# Patient Record
Sex: Female | Born: 1960 | Race: Black or African American | Hispanic: No | Marital: Single | Smoking: Former smoker
Health system: Southern US, Community
[De-identification: ages and names within clinical notes are randomized; demographics above are authoritative.]

## PROBLEM LIST (undated history)

## (undated) DIAGNOSIS — M797 Fibromyalgia: Secondary | ICD-10-CM

## (undated) DIAGNOSIS — F329 Major depressive disorder, single episode, unspecified: Secondary | ICD-10-CM

## (undated) DIAGNOSIS — J302 Other seasonal allergic rhinitis: Secondary | ICD-10-CM

## (undated) DIAGNOSIS — F32A Depression, unspecified: Secondary | ICD-10-CM

## (undated) DIAGNOSIS — J4489 Other specified chronic obstructive pulmonary disease: Secondary | ICD-10-CM

## (undated) DIAGNOSIS — J301 Allergic rhinitis due to pollen: Secondary | ICD-10-CM

## (undated) DIAGNOSIS — K219 Gastro-esophageal reflux disease without esophagitis: Secondary | ICD-10-CM

## (undated) DIAGNOSIS — M7918 Myalgia, other site: Secondary | ICD-10-CM

## (undated) DIAGNOSIS — J45909 Unspecified asthma, uncomplicated: Secondary | ICD-10-CM

## (undated) DIAGNOSIS — G4734 Idiopathic sleep related nonobstructive alveolar hypoventilation: Secondary | ICD-10-CM

## (undated) DIAGNOSIS — F411 Generalized anxiety disorder: Secondary | ICD-10-CM

## (undated) DIAGNOSIS — G4730 Sleep apnea, unspecified: Secondary | ICD-10-CM

## (undated) DIAGNOSIS — D649 Anemia, unspecified: Secondary | ICD-10-CM

## (undated) DIAGNOSIS — J449 Chronic obstructive pulmonary disease, unspecified: Secondary | ICD-10-CM

## (undated) DIAGNOSIS — M1990 Unspecified osteoarthritis, unspecified site: Secondary | ICD-10-CM

## (undated) DIAGNOSIS — M069 Rheumatoid arthritis, unspecified: Secondary | ICD-10-CM

## (undated) DIAGNOSIS — I10 Essential (primary) hypertension: Secondary | ICD-10-CM

## (undated) DIAGNOSIS — M359 Systemic involvement of connective tissue, unspecified: Secondary | ICD-10-CM

## (undated) DIAGNOSIS — R011 Cardiac murmur, unspecified: Secondary | ICD-10-CM

## (undated) DIAGNOSIS — F419 Anxiety disorder, unspecified: Secondary | ICD-10-CM

## (undated) HISTORY — PX: ANKLE FRACTURE SURGERY: SHX122

## (undated) HISTORY — PX: JOINT REPLACEMENT: SHX530

## (undated) HISTORY — PX: FRACTURE SURGERY: SHX138

## (undated) HISTORY — PX: ABDOMINAL HYSTERECTOMY: SHX81

## (undated) HISTORY — PX: TUBAL LIGATION: SHX77

---

## 2003-11-24 ENCOUNTER — Emergency Department: Payer: Self-pay | Admitting: Emergency Medicine

## 2004-01-16 ENCOUNTER — Inpatient Hospital Stay: Payer: Self-pay | Admitting: Obstetrics and Gynecology

## 2004-01-24 ENCOUNTER — Emergency Department: Payer: Self-pay | Admitting: Unknown Physician Specialty

## 2006-03-03 ENCOUNTER — Emergency Department: Payer: Self-pay | Admitting: Emergency Medicine

## 2006-03-03 ENCOUNTER — Other Ambulatory Visit: Payer: Self-pay

## 2006-03-06 ENCOUNTER — Ambulatory Visit: Payer: Self-pay | Admitting: Family Medicine

## 2010-03-19 ENCOUNTER — Emergency Department: Payer: Self-pay | Admitting: Emergency Medicine

## 2011-01-03 ENCOUNTER — Emergency Department: Payer: Self-pay | Admitting: *Deleted

## 2013-04-30 ENCOUNTER — Observation Stay: Payer: Self-pay | Admitting: Internal Medicine

## 2013-04-30 LAB — CBC WITH DIFFERENTIAL/PLATELET
BASOS ABS: 0.1 10*3/uL (ref 0.0–0.1)
Basophil %: 0.8 %
Eosinophil #: 0.4 10*3/uL (ref 0.0–0.7)
Eosinophil %: 4.3 %
HCT: 40.6 % (ref 35.0–47.0)
HGB: 12.9 g/dL (ref 12.0–16.0)
LYMPHS ABS: 3.3 10*3/uL (ref 1.0–3.6)
Lymphocyte %: 37.7 %
MCH: 29.1 pg (ref 26.0–34.0)
MCHC: 31.8 g/dL — ABNORMAL LOW (ref 32.0–36.0)
MCV: 92 fL (ref 80–100)
Monocyte #: 1.1 x10 3/mm — ABNORMAL HIGH (ref 0.2–0.9)
Monocyte %: 12.6 %
Neutrophil #: 3.9 10*3/uL (ref 1.4–6.5)
Neutrophil %: 44.6 %
Platelet: 406 10*3/uL (ref 150–440)
RBC: 4.44 10*6/uL (ref 3.80–5.20)
RDW: 15.4 % — ABNORMAL HIGH (ref 11.5–14.5)
WBC: 8.8 10*3/uL (ref 3.6–11.0)

## 2013-04-30 LAB — BASIC METABOLIC PANEL
ANION GAP: 4 — AB (ref 7–16)
BUN: 16 mg/dL (ref 7–18)
CO2: 28 mmol/L (ref 21–32)
CREATININE: 0.85 mg/dL (ref 0.60–1.30)
Calcium, Total: 9.2 mg/dL (ref 8.5–10.1)
Chloride: 106 mmol/L (ref 98–107)
EGFR (African American): >60
Glucose: 124 mg/dL — ABNORMAL HIGH (ref 65–99)
OSMOLALITY: 278 (ref 275–301)
POTASSIUM: 4 mmol/L (ref 3.5–5.1)
Sodium: 138 mmol/L (ref 136–145)

## 2013-04-30 LAB — D-DIMER(ARMC): D-Dimer: 1205 ng/ml

## 2013-04-30 LAB — TROPONIN I: Troponin-I: 0.02 ng/mL

## 2013-05-01 DIAGNOSIS — I517 Cardiomegaly: Secondary | ICD-10-CM

## 2013-05-01 LAB — TROPONIN I
Troponin-I: 0.12 ng/mL — ABNORMAL HIGH
Troponin-I: 0.13 ng/mL — ABNORMAL HIGH
Troponin-I: 0.18 ng/mL — ABNORMAL HIGH
Troponin-I: 0.14 ng/mL — ABNORMAL HIGH

## 2014-03-29 ENCOUNTER — Emergency Department: Payer: Self-pay | Admitting: Emergency Medicine

## 2014-04-20 DIAGNOSIS — M8580 Other specified disorders of bone density and structure, unspecified site: Secondary | ICD-10-CM | POA: Diagnosis not present

## 2014-04-20 DIAGNOSIS — M25461 Effusion, right knee: Secondary | ICD-10-CM | POA: Diagnosis not present

## 2014-04-20 DIAGNOSIS — M25562 Pain in left knee: Secondary | ICD-10-CM | POA: Diagnosis not present

## 2014-04-20 DIAGNOSIS — M170 Bilateral primary osteoarthritis of knee: Secondary | ICD-10-CM | POA: Diagnosis not present

## 2014-04-20 DIAGNOSIS — M25462 Effusion, left knee: Secondary | ICD-10-CM | POA: Diagnosis not present

## 2014-04-20 DIAGNOSIS — M25561 Pain in right knee: Secondary | ICD-10-CM | POA: Diagnosis not present

## 2014-04-30 NOTE — H&P (Signed)
PATIENT NAME:  Michelle Dalton, MEAS MR#:  [id number] DATE OF BIRTH:  1960-01-13  DATE OF ADMISSION:  04/30/2013  REFERRING PHYSICIAN: Dr. Jacqualine Code.  PRIMARY CARE PHYSICIAN: At [facility].   CHIEF COMPLAINT: Passing out.  HISTORY OF PRESENT ILLNESS: A 54 year old with a history of asthma, which is persistent, but well-controlled presenting after a syncopal episode and she has had shortness of breath for a 2-3-day duration with associated nonproductive cough. No fevers, chills, chest pain, palpitations. She states that her onsetting factor was the pollen and  she has had an episode of coughing which she describes as "bad" but was nonproductive, associated with shortness of breath. She went to get her nebulizer; however, after getting her nebulizer treatment she was still coughing. She passed out. She unfortunately does not recall any further parts of the events. No loss of bowel or bladder function. Per her husband, who witnessed the event, she simply slumped over. However, he thought that she turned blue. No loss of pulse. Spontaneous returned to baseline within a "few minutes." She is currently asymptomatic.   REVIEW OF SYSTEMS:  CONSTITUTIONAL: Denies fever, fatigue, weakness. EYES: Denies blurry, double vision, or eye pain.  HEENT: Denies tinnitus, ear pain, hearing loss.  RESPIRATORY: Positive for cough and shortness of breath as described above.  CARDIOVASCULAR: Denies chest pain, palpitations, edema.  GASTROINTESTINAL: Denies nausea, vomiting, diarrhea, abdominal pain.  GENITOURINARY: Denies dysuria or hematuria.  ENDOCRINE: Denies nocturia or thyroid problems.  HEMATOLOGIC AND LYMPHATIC: Denies easy bruising, bleeding. SKIN: Denies rash or lesions. MUSCULOSKELETAL: Denies pain in neck, back, shoulders, knees or hips.  NEUROLOGIC: Denies paralysis or paresthesias.  PSYCHIATRIC: Denies anxiety or depressive symptoms.   Otherwise, full review of systems performed is negative. Review of systems.    PAST MEDICAL HISTORY: Hypertension and asthma, which is persistent, but well-controlled. On a regular basis, she does not need her rescue inhaler. She has never required intubation.   SOCIAL HISTORY: Denies any tobacco or drug usage. Positive for occasional alcohol.   FAMILY HISTORY: Positive for coronary artery to coronary artery disease.   ALLERGIES: IBUPROFEN.   HOME MEDICATIONS: Include, all of unknown dosages, but Advair, albuterol, hydrochlorothiazide, and Norvasc and Singulair.   PHYSICAL EXAMINATION:  VITAL SIGNS: Temperature 98, heart rate 82, respirations 22, blood pressure 131/82, saturating 96% on room air. Weight 98 kg, BMI 33.9.  GENERAL: Well-nourished, well-developed, African American female, currently in no acute distress.  HEAD: Normocephalic, atraumatic.  EYES: Pupils equal, round, and reactive light. Extraocular muscles intact. No scleral icterus.  MOUTH: Moist mucosal membrane. Dentition intact. No abscess noted EARS, NOSE, AND THROAT: Clear without exudates. No external lesions.  NECK: Supple. No thyromegaly. No nodules. No JVD.  PULMONARY: Clear to auscultation bilaterally without wheezes, rales, or rhonchi. No use of accessory muscles. Good respiratory effort.  CHEST:  Nontender to palpation.  CARDIOVASCULAR: S1, S2, regular rate and rhythm. No murmurs, rubs, or gallops. No edema. Pedal pulses 2+ bilaterally.  GASTROINTESTINAL: Soft, nontender, nondistended. No masses. Positive bowel sounds. No hepatosplenomegaly.  MUSCULOSKELETAL: No swelling, clubbing, or edema. Range of motion is full in all extremities. NEUROLOGIC: Cranial nerves II through XII intact. No gross neurologic deficits. Sensation intact. Reflexes intact.  SKIN: No ulcerations, lesions, no rashes, or cyanosis. Skin warm and dry. Turgor intact. PSYCHIATRIC: Mood and affect within normal limits. The patient awake, alert, and oriented x 3. Insight and judgment intact.   LABORATORY DATA:  EKG: Normal  sinus rhythm with evidence of LVH. The remainder of laboratory data:  Chest x-ray performed revealing mild vascular congestion with cardiomegaly.  Remainder of laboratory data: Sodium 138, potassium 4, chloride 106, bicarbonate 28, BUN 16, creatinine 0.85, glucose 124, troponin less than 0.02. WBC 8.8, hemoglobin 12.9, platelets 406.   ASSESSMENT AND PLAN: A 54 year old [place] American female with a history of asthma presenting after a syncopal episode.  1. Syncope, we will admit to telemetry under an observational status. Trend cardiac enzymes x 3. Question whether this was a posttussive vagal response. Her Wells Score is zero. However, we will check D-dimer given onset of shortness of breath followed by syncope. If positive, we will get CT angiogram to rule out pulmonary embolism, already discussed with the patient.  2. Asthma exacerbation. We will continue her home medications, add p.r.n. nebulizer treatments as required, supplemental O2 to keep oxygen saturation greater than 92% and Solu-Medrol 60 mg daily.  3. Hypertension. Continue with Norvasc.  4. Venous thromboembolism prophylaxis with heparin subcutaneous.   CODE STATUS: The patient is full code.   TIME SPENT: 45 minutes.    ____________________________ Aaron Mose. Issabela Lesko, MD dkh:lt D: 04/30/2013 22:27:22 ET T: 04/30/2013 22:47:11 ET JOB#: [id number]  cc: Aaron Mose. Fredericka Bottcher, MD, <Dictator> Louise Victory Woodfin Ganja MD ELECTRONICALLY SIGNED 05/01/2013 1:37

## 2014-04-30 NOTE — Discharge Summary (Signed)
PATIENT NAME:  Michelle Dalton, HARTL MR#:  [id number] DATE OF BIRTH:  08/01/1960  DATE OF ADMISSION:  04/30/2013 DATE OF DISCHARGE:  05/01/2013  ADMISSION DIAGNOSES:  1. Syncope.  2. Asthma exacerbation.  DISCHARGE DIAGNOSES:  1. Syncope, likely secondary vasovagal reaction.  2. Asthma.  3. Hypertension. 4. Elevated troponin.   CONSULTATIONS: None.   IMAGING: The patient underwent a 2D echocardiogram which essentially showed normal ejection fraction. She has mild concentric LVH.   Troponins at discharge: 0.14. Troponin max 0.18.   HOSPITAL COURSE: A verapamil 54 year old female with a history of asthma, who presented after a syncopal event. For further details, please refer to the H and P.  1. Syncope. I suspect this is likely related to vasovagal reaction as she was coughing and had an acute exacerbation of her asthma and then apparently turned blue and according to the husband, he had to perform some CPR. She had no evidence on telemetry. Her troponins were slightly elevated but I think her troponins were elevated to demand ischemia from her hypoxia from her asthma exacerbation. She is doing well. Her echocardiogram shows no acute etiology for syncope.  2. Elevated troponins likely demand ischemia from her cardiopulmonary resuscitation/asthma. The patient denied any chest pain. No history of coronary artery disease. Her echocardiogram as stated above. Troponins are trending down.  3. CT of the chest is negative for pulmonary embolism. 4. Asthma exacerbation is improved. The patient will be discharged with prednisone taper.  5. Hypertension, on Norvasc.  DISCHARGE MEDICATIONS:  1. Prednisone taper starting at 50 mg, taper by 10 mg every 2 days.  2. Norvasc.  3. Hydrochlorothiazide. 4. Singulair. 5. Albuterol ipratropium nebulizer.   DISCHARGE DIET: Low sodium.   DISCHARGE ACTIVITY: As tolerated.   DISCHARGE FOLLOWUP: The patient will need a followup with her primary care physician in  2 weeks.   TIME SPENT: Approximately 35 minutes.    ____________________________ Donell Beers. Benjie Karvonen, MD spm:lt D: 05/01/2013 12:46:45 ET T: 05/02/2013 01:42:48 ET JOB#: [id number]  cc: Luqman Perrelli P. Benjie Karvonen, MD, <Dictator> Donell Beers Jayla Mackie MD ELECTRONICALLY SIGNED 05/02/2013 13:31

## 2014-05-02 DIAGNOSIS — J4520 Mild intermittent asthma, uncomplicated: Secondary | ICD-10-CM | POA: Diagnosis not present

## 2014-05-02 DIAGNOSIS — I10 Essential (primary) hypertension: Secondary | ICD-10-CM | POA: Diagnosis not present

## 2014-05-02 DIAGNOSIS — J302 Other seasonal allergic rhinitis: Secondary | ICD-10-CM | POA: Diagnosis not present

## 2014-05-02 DIAGNOSIS — M170 Bilateral primary osteoarthritis of knee: Secondary | ICD-10-CM | POA: Diagnosis not present

## 2014-05-26 DIAGNOSIS — M7061 Trochanteric bursitis, right hip: Secondary | ICD-10-CM | POA: Diagnosis not present

## 2014-05-26 DIAGNOSIS — I10 Essential (primary) hypertension: Secondary | ICD-10-CM | POA: Diagnosis not present

## 2014-05-26 DIAGNOSIS — M170 Bilateral primary osteoarthritis of knee: Secondary | ICD-10-CM | POA: Diagnosis not present

## 2014-05-26 DIAGNOSIS — G894 Chronic pain syndrome: Secondary | ICD-10-CM | POA: Diagnosis not present

## 2014-06-02 ENCOUNTER — Encounter: Payer: Self-pay | Admitting: Medical Oncology

## 2014-06-02 ENCOUNTER — Emergency Department
Admission: EM | Admit: 2014-06-02 | Discharge: 2014-06-02 | Disposition: A | Discharge status: Home / Self Care | Payer: Commercial Managed Care - HMO | Attending: Emergency Medicine | Admitting: Emergency Medicine

## 2014-06-02 DIAGNOSIS — I10 Essential (primary) hypertension: Secondary | ICD-10-CM | POA: Diagnosis not present

## 2014-06-02 DIAGNOSIS — R51 Headache: Secondary | ICD-10-CM | POA: Diagnosis present

## 2014-06-02 DIAGNOSIS — J0100 Acute maxillary sinusitis, unspecified: Secondary | ICD-10-CM | POA: Insufficient documentation

## 2014-06-02 HISTORY — DX: Other seasonal allergic rhinitis: J30.2

## 2014-06-02 HISTORY — DX: Essential (primary) hypertension: I10

## 2014-06-02 HISTORY — DX: Unspecified osteoarthritis, unspecified site: M19.90

## 2014-06-02 HISTORY — DX: Unspecified asthma, uncomplicated: J45.909

## 2014-06-02 MED ORDER — PREDNISONE 10 MG PO TABS: 50.0000 mg | ORAL_TABLET | Freq: Every day | ORAL | Status: DC

## 2014-06-02 MED ORDER — SULFAMETHOXAZOLE-TRIMETHOPRIM 800-160 MG PO TABS: 1.0000 | ORAL_TABLET | Freq: Two times a day (BID) | ORAL | Status: DC

## 2014-06-02 NOTE — ED Provider Notes (Signed)
[facility] Emergency Department Provider Note  ____________________________________________  Time seen: Approximately 1:46 PM  I have reviewed the triage vital signs and the nursing notes.   HISTORY  Chief Complaint Facial Pain    HPI Michelle Dalton is a 54 y.o. female who presents with a 3 or four-day history of right facial pain and pressure and generalized headache. She states that she has felt her allergies were flaring up and she tried using her Nasonex without any relief. She states the pain under the right eye and the side of her nose is getting worse.   Past Medical History  Diagnosis Date  . Hypertension   . Asthma   . Seasonal allergies   . Arthritis     There are no active problems to display for this patient.   History reviewed. No pertinent past surgical history.  Current Outpatient Rx  Name  Route  Sig  Dispense  Refill  . predniSONE (DELTASONE) 10 MG tablet   Oral   Take 5 tablets (50 mg total) by mouth daily.   25 tablet   0   . sulfamethoxazole-trimethoprim (BACTRIM DS,SEPTRA DS) 800-160 MG per tablet   Oral   Take 1 tablet by mouth 2 (two) times daily.   20 tablet   0     Allergies Lisinopril  No family history on file.  Social History History  Substance Use Topics  . Smoking status: Never Smoker   . Smokeless tobacco: Not on file  . Alcohol Use: No    Review of Systems Constitutional: No fever/chills Eyes: No visual changes. ENT: No sore throat. Cardiovascular: Denies chest pain. Respiratory: Denies shortness of breath. Gastrointestinal: No abdominal pain.  No nausea, no vomiting.  No diarrhea.  No constipation. Genitourinary: Negative for dysuria. Musculoskeletal: Negative for back pain. Skin: Negative for rash. Neurological: Positive for headaches, negative for focal weakness or numbness.  10-point ROS otherwise negative.  ____________________________________________   PHYSICAL EXAM:  VITAL  SIGNS: ED Triage Vitals  Enc Vitals Group     BP 06/02/14 1158 164/77 mmHg     Pulse Rate 06/02/14 1158 92     Resp 06/02/14 1158 18     Temp 06/02/14 1158 97.9 F (36.6 C)     Temp Source 06/02/14 1158 Oral     SpO2 06/02/14 1158 100 %     Weight 06/02/14 1158 214 lb (97.07 kg)     Height 06/02/14 1158 5\' 7"  (1.702 m)     Head Cir --      Peak Flow --      Pain Score 06/02/14 1158 10     Pain Loc --      Pain Edu? --      Excl. in [place]? --     Constitutional: Alert and oriented. Well appearing and in no acute distress. Eyes: Conjunctivae are normal. PERRL. EOMI. Head: Atraumatic. Nose: No congestion/rhinnorhea. Mouth/Throat: Mucous membranes are moist.  Oropharynx non-erythematous. Tenderness over maxillary and ethmoid sinus on the right. Neck: No stridor.   Hematological/Lymphatic/Immunilogical: No cervical lymphadenopathy. Cardiovascular: Normal rate, regular rhythm. Grossly normal heart sounds.  Good peripheral circulation. Respiratory: Normal respiratory effort.  No retractions. Lungs CTAB. Gastrointestinal: Soft and nontender. No distention.  Musculoskeletal: No lower extremity tenderness nor edema.  No joint effusions. Neurologic:  Normal speech and language. No gross focal neurologic deficits are appreciated. Speech is normal. No gait instability. Skin:  Skin is warm, dry and intact. No rash noted. Psychiatric: Mood and affect are  normal. Speech and behavior are normal.  ____________________________________________   LABS (all labs ordered are listed, but only abnormal results are displayed)  Labs Reviewed - No data to display ____________________________________________  EKG   ____________________________________________  RADIOLOGY   ____________________________________________   PROCEDURES  Procedure(s) performed: None  Critical Care performed: No  ____________________________________________   INITIAL IMPRESSION / ASSESSMENT AND PLAN / ED  COURSE  Pertinent labs & imaging results that were available during my care of the patient were reviewed by me and considered in my medical decision making (see chart for details).  Patient was advised to follow up with her primary care provider for symptoms that are not improving after 2 days. She was advised to return to the emergency department for symptoms that change or worsen if she is unable schedule an appointment. ____________________________________________   FINAL CLINICAL IMPRESSION(S) / ED DIAGNOSES  Final diagnoses:  Acute maxillary sinusitis, recurrence not specified      Victorino Dike, FNP 06/02/14 1350  Patient was seen by the mid-level I was in the ER and available for consult during this time  Nena Polio, MD 06/02/14 1555

## 2014-06-02 NOTE — ED Notes (Signed)
Pt ambulatory to triage with reports of rt sided facial pressure for a few days. Also reports nasal congestion.

## 2014-06-20 DIAGNOSIS — I10 Essential (primary) hypertension: Secondary | ICD-10-CM | POA: Diagnosis not present

## 2014-06-20 DIAGNOSIS — Z01810 Encounter for preprocedural cardiovascular examination: Secondary | ICD-10-CM | POA: Diagnosis not present

## 2014-07-18 DIAGNOSIS — M1990 Unspecified osteoarthritis, unspecified site: Secondary | ICD-10-CM | POA: Diagnosis not present

## 2014-07-18 DIAGNOSIS — M21062 Valgus deformity, not elsewhere classified, left knee: Secondary | ICD-10-CM | POA: Diagnosis not present

## 2014-07-18 DIAGNOSIS — Z01818 Encounter for other preprocedural examination: Secondary | ICD-10-CM | POA: Diagnosis not present

## 2014-07-18 DIAGNOSIS — Z01812 Encounter for preprocedural laboratory examination: Secondary | ICD-10-CM | POA: Diagnosis not present

## 2014-07-26 DIAGNOSIS — Z7982 Long term (current) use of aspirin: Secondary | ICD-10-CM | POA: Diagnosis not present

## 2014-07-26 DIAGNOSIS — I10 Essential (primary) hypertension: Secondary | ICD-10-CM | POA: Diagnosis not present

## 2014-07-26 DIAGNOSIS — M1712 Unilateral primary osteoarthritis, left knee: Secondary | ICD-10-CM | POA: Diagnosis not present

## 2014-07-26 DIAGNOSIS — F419 Anxiety disorder, unspecified: Secondary | ICD-10-CM | POA: Diagnosis not present

## 2014-07-26 DIAGNOSIS — T797XXA Traumatic subcutaneous emphysema, initial encounter: Secondary | ICD-10-CM | POA: Diagnosis not present

## 2014-07-26 DIAGNOSIS — M1612 Unilateral primary osteoarthritis, left hip: Secondary | ICD-10-CM | POA: Diagnosis not present

## 2014-07-26 DIAGNOSIS — M21062 Valgus deformity, not elsewhere classified, left knee: Secondary | ICD-10-CM | POA: Diagnosis not present

## 2014-07-26 DIAGNOSIS — Z96652 Presence of left artificial knee joint: Secondary | ICD-10-CM | POA: Diagnosis not present

## 2014-07-26 DIAGNOSIS — Z9889 Other specified postprocedural states: Secondary | ICD-10-CM | POA: Diagnosis not present

## 2014-07-26 DIAGNOSIS — J4520 Mild intermittent asthma, uncomplicated: Secondary | ICD-10-CM | POA: Diagnosis not present

## 2014-07-26 DIAGNOSIS — F329 Major depressive disorder, single episode, unspecified: Secondary | ICD-10-CM | POA: Diagnosis not present

## 2014-07-26 DIAGNOSIS — Z8249 Family history of ischemic heart disease and other diseases of the circulatory system: Secondary | ICD-10-CM | POA: Diagnosis not present

## 2014-07-29 DIAGNOSIS — M1990 Unspecified osteoarthritis, unspecified site: Secondary | ICD-10-CM | POA: Diagnosis not present

## 2014-07-29 DIAGNOSIS — J45909 Unspecified asthma, uncomplicated: Secondary | ICD-10-CM | POA: Diagnosis not present

## 2014-07-29 DIAGNOSIS — F418 Other specified anxiety disorders: Secondary | ICD-10-CM | POA: Diagnosis not present

## 2014-07-29 DIAGNOSIS — I10 Essential (primary) hypertension: Secondary | ICD-10-CM | POA: Diagnosis not present

## 2014-07-29 DIAGNOSIS — Z7982 Long term (current) use of aspirin: Secondary | ICD-10-CM | POA: Diagnosis not present

## 2014-07-29 DIAGNOSIS — Z96652 Presence of left artificial knee joint: Secondary | ICD-10-CM | POA: Diagnosis not present

## 2014-07-29 DIAGNOSIS — Z4801 Encounter for change or removal of surgical wound dressing: Secondary | ICD-10-CM | POA: Diagnosis not present

## 2014-07-29 DIAGNOSIS — Z471 Aftercare following joint replacement surgery: Secondary | ICD-10-CM | POA: Diagnosis not present

## 2014-08-01 DIAGNOSIS — Z471 Aftercare following joint replacement surgery: Secondary | ICD-10-CM | POA: Diagnosis not present

## 2014-08-01 DIAGNOSIS — J45909 Unspecified asthma, uncomplicated: Secondary | ICD-10-CM | POA: Diagnosis not present

## 2014-08-01 DIAGNOSIS — Z96652 Presence of left artificial knee joint: Secondary | ICD-10-CM | POA: Diagnosis not present

## 2014-08-01 DIAGNOSIS — I10 Essential (primary) hypertension: Secondary | ICD-10-CM | POA: Diagnosis not present

## 2014-08-01 DIAGNOSIS — Z4801 Encounter for change or removal of surgical wound dressing: Secondary | ICD-10-CM | POA: Diagnosis not present

## 2014-08-01 DIAGNOSIS — F418 Other specified anxiety disorders: Secondary | ICD-10-CM | POA: Diagnosis not present

## 2014-08-01 DIAGNOSIS — M1990 Unspecified osteoarthritis, unspecified site: Secondary | ICD-10-CM | POA: Diagnosis not present

## 2014-08-01 DIAGNOSIS — Z7982 Long term (current) use of aspirin: Secondary | ICD-10-CM | POA: Diagnosis not present

## 2014-08-03 DIAGNOSIS — Z96652 Presence of left artificial knee joint: Secondary | ICD-10-CM | POA: Diagnosis not present

## 2014-08-03 DIAGNOSIS — J45909 Unspecified asthma, uncomplicated: Secondary | ICD-10-CM | POA: Diagnosis not present

## 2014-08-03 DIAGNOSIS — Z7982 Long term (current) use of aspirin: Secondary | ICD-10-CM | POA: Diagnosis not present

## 2014-08-03 DIAGNOSIS — I10 Essential (primary) hypertension: Secondary | ICD-10-CM | POA: Diagnosis not present

## 2014-08-03 DIAGNOSIS — Z4801 Encounter for change or removal of surgical wound dressing: Secondary | ICD-10-CM | POA: Diagnosis not present

## 2014-08-03 DIAGNOSIS — Z471 Aftercare following joint replacement surgery: Secondary | ICD-10-CM | POA: Diagnosis not present

## 2014-08-03 DIAGNOSIS — M1990 Unspecified osteoarthritis, unspecified site: Secondary | ICD-10-CM | POA: Diagnosis not present

## 2014-08-03 DIAGNOSIS — F418 Other specified anxiety disorders: Secondary | ICD-10-CM | POA: Diagnosis not present

## 2014-08-05 DIAGNOSIS — Z4801 Encounter for change or removal of surgical wound dressing: Secondary | ICD-10-CM | POA: Diagnosis not present

## 2014-08-05 DIAGNOSIS — J45909 Unspecified asthma, uncomplicated: Secondary | ICD-10-CM | POA: Diagnosis not present

## 2014-08-05 DIAGNOSIS — M1990 Unspecified osteoarthritis, unspecified site: Secondary | ICD-10-CM | POA: Diagnosis not present

## 2014-08-05 DIAGNOSIS — F418 Other specified anxiety disorders: Secondary | ICD-10-CM | POA: Diagnosis not present

## 2014-08-05 DIAGNOSIS — Z471 Aftercare following joint replacement surgery: Secondary | ICD-10-CM | POA: Diagnosis not present

## 2014-08-05 DIAGNOSIS — Z7982 Long term (current) use of aspirin: Secondary | ICD-10-CM | POA: Diagnosis not present

## 2014-08-05 DIAGNOSIS — Z96652 Presence of left artificial knee joint: Secondary | ICD-10-CM | POA: Diagnosis not present

## 2014-08-05 DIAGNOSIS — I10 Essential (primary) hypertension: Secondary | ICD-10-CM | POA: Diagnosis not present

## 2014-08-08 DIAGNOSIS — Z96652 Presence of left artificial knee joint: Secondary | ICD-10-CM | POA: Diagnosis not present

## 2014-08-08 DIAGNOSIS — F418 Other specified anxiety disorders: Secondary | ICD-10-CM | POA: Diagnosis not present

## 2014-08-08 DIAGNOSIS — J45909 Unspecified asthma, uncomplicated: Secondary | ICD-10-CM | POA: Diagnosis not present

## 2014-08-08 DIAGNOSIS — Z471 Aftercare following joint replacement surgery: Secondary | ICD-10-CM | POA: Diagnosis not present

## 2014-08-08 DIAGNOSIS — Z7982 Long term (current) use of aspirin: Secondary | ICD-10-CM | POA: Diagnosis not present

## 2014-08-08 DIAGNOSIS — M1990 Unspecified osteoarthritis, unspecified site: Secondary | ICD-10-CM | POA: Diagnosis not present

## 2014-08-08 DIAGNOSIS — I10 Essential (primary) hypertension: Secondary | ICD-10-CM | POA: Diagnosis not present

## 2014-08-08 DIAGNOSIS — Z4801 Encounter for change or removal of surgical wound dressing: Secondary | ICD-10-CM | POA: Diagnosis not present

## 2014-08-12 DIAGNOSIS — Z96652 Presence of left artificial knee joint: Secondary | ICD-10-CM | POA: Diagnosis not present

## 2014-08-12 DIAGNOSIS — I10 Essential (primary) hypertension: Secondary | ICD-10-CM | POA: Diagnosis not present

## 2014-08-12 DIAGNOSIS — Z7982 Long term (current) use of aspirin: Secondary | ICD-10-CM | POA: Diagnosis not present

## 2014-08-12 DIAGNOSIS — J45909 Unspecified asthma, uncomplicated: Secondary | ICD-10-CM | POA: Diagnosis not present

## 2014-08-12 DIAGNOSIS — F418 Other specified anxiety disorders: Secondary | ICD-10-CM | POA: Diagnosis not present

## 2014-08-12 DIAGNOSIS — M1990 Unspecified osteoarthritis, unspecified site: Secondary | ICD-10-CM | POA: Diagnosis not present

## 2014-08-12 DIAGNOSIS — Z471 Aftercare following joint replacement surgery: Secondary | ICD-10-CM | POA: Diagnosis not present

## 2014-08-12 DIAGNOSIS — Z4801 Encounter for change or removal of surgical wound dressing: Secondary | ICD-10-CM | POA: Diagnosis not present

## 2014-08-16 DIAGNOSIS — M25562 Pain in left knee: Secondary | ICD-10-CM | POA: Diagnosis not present

## 2014-08-16 DIAGNOSIS — Z96652 Presence of left artificial knee joint: Secondary | ICD-10-CM | POA: Diagnosis not present

## 2014-08-19 DIAGNOSIS — Z96652 Presence of left artificial knee joint: Secondary | ICD-10-CM | POA: Diagnosis not present

## 2014-08-19 DIAGNOSIS — M25562 Pain in left knee: Secondary | ICD-10-CM | POA: Diagnosis not present

## 2014-08-31 DIAGNOSIS — J0110 Acute frontal sinusitis, unspecified: Secondary | ICD-10-CM | POA: Diagnosis not present

## 2014-08-31 DIAGNOSIS — J4520 Mild intermittent asthma, uncomplicated: Secondary | ICD-10-CM | POA: Diagnosis not present

## 2014-08-31 DIAGNOSIS — J4521 Mild intermittent asthma with (acute) exacerbation: Secondary | ICD-10-CM | POA: Diagnosis not present

## 2014-08-31 DIAGNOSIS — Z1389 Encounter for screening for other disorder: Secondary | ICD-10-CM | POA: Diagnosis not present

## 2014-08-31 DIAGNOSIS — Z23 Encounter for immunization: Secondary | ICD-10-CM | POA: Diagnosis not present

## 2014-08-31 DIAGNOSIS — J302 Other seasonal allergic rhinitis: Secondary | ICD-10-CM | POA: Diagnosis not present

## 2014-08-31 DIAGNOSIS — F329 Major depressive disorder, single episode, unspecified: Secondary | ICD-10-CM | POA: Diagnosis not present

## 2014-08-31 DIAGNOSIS — I10 Essential (primary) hypertension: Secondary | ICD-10-CM | POA: Diagnosis not present

## 2014-11-17 DIAGNOSIS — I10 Essential (primary) hypertension: Secondary | ICD-10-CM | POA: Diagnosis not present

## 2014-11-17 DIAGNOSIS — Z23 Encounter for immunization: Secondary | ICD-10-CM | POA: Diagnosis not present

## 2014-11-17 DIAGNOSIS — M1611 Unilateral primary osteoarthritis, right hip: Secondary | ICD-10-CM | POA: Diagnosis not present

## 2014-11-17 DIAGNOSIS — Z8739 Personal history of other diseases of the musculoskeletal system and connective tissue: Secondary | ICD-10-CM | POA: Diagnosis not present

## 2014-11-17 DIAGNOSIS — J4520 Mild intermittent asthma, uncomplicated: Secondary | ICD-10-CM | POA: Diagnosis not present

## 2014-11-28 DIAGNOSIS — M1611 Unilateral primary osteoarthritis, right hip: Secondary | ICD-10-CM | POA: Diagnosis not present

## 2014-12-21 DIAGNOSIS — M1611 Unilateral primary osteoarthritis, right hip: Secondary | ICD-10-CM | POA: Diagnosis not present

## 2015-02-06 DIAGNOSIS — M1711 Unilateral primary osteoarthritis, right knee: Secondary | ICD-10-CM | POA: Diagnosis not present

## 2015-02-06 DIAGNOSIS — M1611 Unilateral primary osteoarthritis, right hip: Secondary | ICD-10-CM | POA: Diagnosis not present

## 2015-02-21 DIAGNOSIS — M2550 Pain in unspecified joint: Secondary | ICD-10-CM | POA: Diagnosis not present

## 2015-02-21 DIAGNOSIS — M79642 Pain in left hand: Secondary | ICD-10-CM | POA: Diagnosis not present

## 2015-02-21 DIAGNOSIS — M79641 Pain in right hand: Secondary | ICD-10-CM | POA: Diagnosis not present

## 2015-02-22 ENCOUNTER — Encounter
Admission: RE | Admit: 2015-02-22 | Discharge: 2015-02-22 | Disposition: A | Discharge status: Home / Self Care | Payer: Commercial Managed Care - HMO | Source: Ambulatory Visit | Attending: Surgery | Admitting: Surgery

## 2015-02-22 ENCOUNTER — Ambulatory Visit
Admission: RE | Admit: 2015-02-22 | Discharge: 2015-02-22 | Disposition: A | Discharge status: Home / Self Care | Payer: Commercial Managed Care - HMO | Source: Ambulatory Visit | Attending: Surgery | Admitting: Surgery

## 2015-02-22 DIAGNOSIS — I10 Essential (primary) hypertension: Secondary | ICD-10-CM | POA: Insufficient documentation

## 2015-02-22 DIAGNOSIS — Z01812 Encounter for preprocedural laboratory examination: Secondary | ICD-10-CM | POA: Insufficient documentation

## 2015-02-22 DIAGNOSIS — Z01818 Encounter for other preprocedural examination: Secondary | ICD-10-CM | POA: Diagnosis not present

## 2015-02-22 DIAGNOSIS — J45909 Unspecified asthma, uncomplicated: Secondary | ICD-10-CM | POA: Insufficient documentation

## 2015-02-22 DIAGNOSIS — Z01811 Encounter for preprocedural respiratory examination: Secondary | ICD-10-CM | POA: Diagnosis not present

## 2015-02-22 LAB — CBC
HEMATOCRIT: 38 % (ref 35.0–47.0)
HEMOGLOBIN: 12.5 g/dL (ref 12.0–16.0)
MCH: 28.7 pg (ref 26.0–34.0)
MCHC: 32.8 g/dL (ref 32.0–36.0)
MCV: 87.6 fL (ref 80.0–100.0)
Platelets: 393 10*3/uL (ref 150–440)
RBC: 4.33 MIL/uL (ref 3.80–5.20)
RDW: 14.9 % — AB (ref 11.5–14.5)
WBC: 10.2 10*3/uL (ref 3.6–11.0)

## 2015-02-22 LAB — BASIC METABOLIC PANEL
Anion gap: 12 (ref 5–15)
BUN: 19 mg/dL (ref 6–20)
CHLORIDE: 100 mmol/L — AB (ref 101–111)
CO2: 26 mmol/L (ref 22–32)
CREATININE: 0.81 mg/dL (ref 0.44–1.00)
Calcium: 9.5 mg/dL (ref 8.9–10.3)
GFR calc Af Amer: >60 mL/min (ref >60)
GLUCOSE: 89 mg/dL (ref 65–99)
Potassium: 3.1 mmol/L — ABNORMAL LOW (ref 3.5–5.1)
Sodium: 138 mmol/L (ref 135–145)

## 2015-02-22 LAB — PROTIME-INR
INR: 0.97
PROTHROMBIN TIME: 13.1 s (ref 11.4–15.0)

## 2015-02-22 LAB — TYPE AND SCREEN
ABO/RH(D): B POS
ANTIBODY SCREEN: NEGATIVE

## 2015-02-22 LAB — ABO/RH: ABO/RH(D): B POS

## 2015-02-22 LAB — SURGICAL PCR SCREEN
MRSA, PCR: POSITIVE — AB
STAPHYLOCOCCUS AUREUS: POSITIVE — AB

## 2015-02-22 NOTE — Pre-Procedure Instructions (Signed)
   Component Name Value Range  EKG Ventricular Rate 86 BPM   EKG Atrial Rate 86 BPM   EKG P-R Interval 156 ms  EKG QRS Duration 100 ms  EKG Q-T Interval 376 ms  EKG QTC Calculation 449 ms  EKG Calculated P Axis 60 degrees   EKG Calculated R Axis 65 degrees   EKG Calculated T Axis 48 degrees    Result Narrative  NORMAL SINUS RHYTHM POSSIBLE LEFT ATRIAL ENLARGEMENT  LEFT VENTRICULAR HYPERTROPHY WHEN COMPARED WITH ECG OF 26-Jun-1990 13:34, NO SIGNIFICANT CHANGE WAS FOUND Confirmed by Beatriz Stallion D [id number]) on 07/19/2014 12:04:22 AM

## 2015-02-22 NOTE — Patient Instructions (Signed)
  Your procedure is scheduled on: Tuesday 02/28/15 Report to Day Surgery. 2ND FLOOR MEDICAL MALL ENTRANCE To find out your arrival time please call [phone] between 1PM - 3PM on Monday 02/27/15.  Remember: Instructions that are not followed completely may result in serious medical risk, up to and including death, or upon the discretion of your surgeon and anesthesiologist your surgery may need to be rescheduled.    __X__ 1. Do not eat food or drink liquids after midnight. No gum chewing or hard candies.     __X__ 2. No Alcohol for 24 hours before or after surgery.   ____ 3. Bring all medications with you on the day of surgery if instructed.    __X__ 4. Notify your doctor if there is any change in your medical condition     (cold, fever, infections).     Do not wear jewelry, make-up, hairpins, clips or nail polish.  Do not wear lotions, powders, or perfumes. .  Do not shave 48 hours prior to surgery. Men may shave face and neck.  Do not bring valuables to the hospital.    [facility] is not responsible for any belongings or valuables.               Contacts, dentures or bridgework may not be worn into surgery.  Leave your suitcase in the car. After surgery it may be brought to your room.  For patients admitted to the hospital, discharge time is determined by your                treatment team.   Patients discharged the day of surgery will not be allowed to drive home.   Please read over the following fact sheets that you were given:   MRSA Information and Surgical Site Infection Prevention   __X__ Take these medicines the morning of surgery with A SIP OF WATER:    1. AMLODIPINE  2. HYDRALAZINE  3. LABETOLOL  4. SINGULAIR  5.  6.  ____ Fleet Enema (as directed)   __X__ Use CHG Soap as directed  __X__ Use inhalers on the day of surgery  ____ Stop metformin 2 days prior to surgery    ____ Take 1/2 of usual insulin dose the night before surgery and none on the morning of  surgery.   ____ Stop Coumadin/Plavix/aspirin on   ____ Stop Anti-inflammatories on    ____ Stop supplements until after surgery.    ____ Bring C-Pap to the hospital.

## 2015-02-23 NOTE — Pre-Procedure Instructions (Signed)
EKG DISCUSSED WITH DR Janeann Forehand AND OK TO PROCEED

## 2015-02-23 NOTE — Pre-Procedure Instructions (Signed)
Faxed labs positive MRSA, and low Potassium 3.1 to Dr Nicholaus Bloom office.

## 2015-02-28 ENCOUNTER — Inpatient Hospital Stay: Payer: Commercial Managed Care - HMO | Admitting: Anesthesiology

## 2015-02-28 ENCOUNTER — Inpatient Hospital Stay: Payer: Commercial Managed Care - HMO

## 2015-02-28 ENCOUNTER — Inpatient Hospital Stay
Admission: RE | Admit: 2015-02-28 | Discharge: 2015-03-02 | DRG: 470 | Disposition: A | Discharge status: Skilled Nursing Facility | Payer: Commercial Managed Care - HMO | Source: Ambulatory Visit | Attending: Surgery | Admitting: Surgery

## 2015-02-28 ENCOUNTER — Encounter: Payer: Self-pay | Admitting: Emergency Medicine

## 2015-02-28 ENCOUNTER — Encounter
Admission: RE | Disposition: A | Discharge status: Skilled Nursing Facility | Payer: Self-pay | Source: Ambulatory Visit | Attending: Surgery

## 2015-02-28 DIAGNOSIS — M1711 Unilateral primary osteoarthritis, right knee: Secondary | ICD-10-CM | POA: Diagnosis not present

## 2015-02-28 DIAGNOSIS — R05 Cough: Secondary | ICD-10-CM

## 2015-02-28 DIAGNOSIS — Z743 Need for continuous supervision: Secondary | ICD-10-CM | POA: Diagnosis not present

## 2015-02-28 DIAGNOSIS — Z8249 Family history of ischemic heart disease and other diseases of the circulatory system: Secondary | ICD-10-CM | POA: Diagnosis not present

## 2015-02-28 DIAGNOSIS — Z791 Long term (current) use of non-steroidal anti-inflammatories (NSAID): Secondary | ICD-10-CM

## 2015-02-28 DIAGNOSIS — J302 Other seasonal allergic rhinitis: Secondary | ICD-10-CM | POA: Diagnosis not present

## 2015-02-28 DIAGNOSIS — F329 Major depressive disorder, single episode, unspecified: Secondary | ICD-10-CM | POA: Diagnosis not present

## 2015-02-28 DIAGNOSIS — Z7982 Long term (current) use of aspirin: Secondary | ICD-10-CM

## 2015-02-28 DIAGNOSIS — M1990 Unspecified osteoarthritis, unspecified site: Secondary | ICD-10-CM | POA: Diagnosis not present

## 2015-02-28 DIAGNOSIS — Z96652 Presence of left artificial knee joint: Secondary | ICD-10-CM | POA: Diagnosis present

## 2015-02-28 DIAGNOSIS — R000 Tachycardia, unspecified: Secondary | ICD-10-CM | POA: Diagnosis not present

## 2015-02-28 DIAGNOSIS — Z836 Family history of other diseases of the respiratory system: Secondary | ICD-10-CM | POA: Diagnosis not present

## 2015-02-28 DIAGNOSIS — R2689 Other abnormalities of gait and mobility: Secondary | ICD-10-CM | POA: Diagnosis not present

## 2015-02-28 DIAGNOSIS — R059 Cough, unspecified: Secondary | ICD-10-CM

## 2015-02-28 DIAGNOSIS — Z96651 Presence of right artificial knee joint: Secondary | ICD-10-CM

## 2015-02-28 DIAGNOSIS — Z79899 Other long term (current) drug therapy: Secondary | ICD-10-CM

## 2015-02-28 DIAGNOSIS — Z8261 Family history of arthritis: Secondary | ICD-10-CM

## 2015-02-28 DIAGNOSIS — Z471 Aftercare following joint replacement surgery: Secondary | ICD-10-CM | POA: Diagnosis not present

## 2015-02-28 DIAGNOSIS — J45909 Unspecified asthma, uncomplicated: Secondary | ICD-10-CM | POA: Diagnosis present

## 2015-02-28 DIAGNOSIS — R531 Weakness: Secondary | ICD-10-CM | POA: Diagnosis not present

## 2015-02-28 DIAGNOSIS — Z833 Family history of diabetes mellitus: Secondary | ICD-10-CM | POA: Diagnosis not present

## 2015-02-28 DIAGNOSIS — D72829 Elevated white blood cell count, unspecified: Secondary | ICD-10-CM | POA: Diagnosis present

## 2015-02-28 DIAGNOSIS — I10 Essential (primary) hypertension: Secondary | ICD-10-CM | POA: Diagnosis present

## 2015-02-28 DIAGNOSIS — Z96659 Presence of unspecified artificial knee joint: Secondary | ICD-10-CM

## 2015-02-28 HISTORY — PX: TOTAL KNEE ARTHROPLASTY: SHX125

## 2015-02-28 LAB — URINALYSIS COMPLETE WITH MICROSCOPIC (ARMC ONLY)
Bilirubin Urine: NEGATIVE
Glucose, UA: NEGATIVE mg/dL
Hgb urine dipstick: NEGATIVE
Ketones, ur: NEGATIVE mg/dL
Nitrite: NEGATIVE
PH: 6 (ref 5.0–8.0)
PROTEIN: NEGATIVE mg/dL
Specific Gravity, Urine: 1.015 (ref 1.005–1.030)

## 2015-02-28 LAB — POCT I-STAT 4, (NA,K, GLUC, HGB,HCT)
GLUCOSE: 110 mg/dL — AB (ref 65–99)
HCT: 40 % (ref 36.0–46.0)
Hemoglobin: 13.6 g/dL (ref 12.0–15.0)
POTASSIUM: 3.2 mmol/L — AB (ref 3.5–5.1)
Sodium: 140 mmol/L (ref 135–145)

## 2015-02-28 SURGERY — ARTHROPLASTY, KNEE, TOTAL
Anesthesia: Spinal | Site: Knee | Laterality: Right

## 2015-02-28 MED ORDER — HYDRALAZINE HCL 10 MG PO TABS
10.0000 mg | ORAL_TABLET | Freq: Three times a day (TID) | ORAL | Status: DC
  Administered 2015-02-28 – 2015-03-02 (×6): 10 mg via ORAL
  Filled 2015-02-28 (×10): qty 1

## 2015-02-28 MED ORDER — VANCOMYCIN HCL 10 G IV SOLR
1500.0000 mg | Freq: Once | INTRAVENOUS | Status: AC
  Administered 2015-02-28 (×2): 1500 mg via INTRAVENOUS
  Filled 2015-02-28: qty 1500

## 2015-02-28 MED ORDER — BUPIVACAINE-EPINEPHRINE (PF) 0.5% -1:200000 IJ SOLN
INTRAMUSCULAR | Status: DC | PRN
  Administered 2015-02-28: 30 mL via PERINEURAL

## 2015-02-28 MED ORDER — TRANEXAMIC ACID 1000 MG/10ML IV SOLN
INTRAVENOUS | Status: DC | PRN
  Administered 2015-02-28: 1000 mg via TOPICAL

## 2015-02-28 MED ORDER — SODIUM CHLORIDE 0.9 % IV SOLN
INTRAVENOUS | Status: DC | PRN
  Administered 2015-02-28: 60 mL

## 2015-02-28 MED ORDER — METOCLOPRAMIDE HCL 5 MG PO TABS
5.0000 mg | ORAL_TABLET | Freq: Three times a day (TID) | ORAL | Status: DC | PRN
  Administered 2015-03-02: 5 mg via ORAL
  Filled 2015-02-28: qty 1

## 2015-02-28 MED ORDER — SODIUM CHLORIDE 0.9 % IJ SOLN
INTRAMUSCULAR | Status: AC
  Filled 2015-02-28: qty 50

## 2015-02-28 MED ORDER — AMITRIPTYLINE HCL 25 MG PO TABS
25.0000 mg | ORAL_TABLET | Freq: Every day | ORAL | Status: DC
  Administered 2015-02-28 – 2015-03-01 (×2): 25 mg via ORAL
  Filled 2015-02-28 (×2): qty 1

## 2015-02-28 MED ORDER — ONDANSETRON HCL 4 MG PO TABS: 4.0000 mg | ORAL_TABLET | Freq: Four times a day (QID) | ORAL | Status: DC | PRN

## 2015-02-28 MED ORDER — BUPIVACAINE LIPOSOME 1.3 % IJ SUSP
INTRAMUSCULAR | Status: AC
  Filled 2015-02-28: qty 20

## 2015-02-28 MED ORDER — BUPIVACAINE-EPINEPHRINE (PF) 0.5% -1:200000 IJ SOLN
INTRAMUSCULAR | Status: AC
  Filled 2015-02-28: qty 30

## 2015-02-28 MED ORDER — NEOMYCIN-POLYMYXIN B GU 40-200000 IR SOLN
Status: AC
  Filled 2015-02-28: qty 20

## 2015-02-28 MED ORDER — MONTELUKAST SODIUM 10 MG PO TABS
10.0000 mg | ORAL_TABLET | Freq: Every day | ORAL | Status: DC
  Administered 2015-03-01: 10 mg via ORAL
  Filled 2015-02-28: qty 1

## 2015-02-28 MED ORDER — ONDANSETRON HCL 4 MG/2ML IJ SOLN: 4.0000 mg | Freq: Four times a day (QID) | INTRAMUSCULAR | Status: DC | PRN

## 2015-02-28 MED ORDER — FAMOTIDINE 20 MG PO TABS
ORAL_TABLET | ORAL | Status: AC
  Administered 2015-02-28: 20 mg via ORAL
  Filled 2015-02-28: qty 1

## 2015-02-28 MED ORDER — FENTANYL CITRATE (PF) 100 MCG/2ML IJ SOLN
INTRAMUSCULAR | Status: DC | PRN
  Administered 2015-02-28 (×2): 50 ug via INTRAVENOUS

## 2015-02-28 MED ORDER — ONDANSETRON HCL 4 MG/2ML IJ SOLN: 4.0000 mg | Freq: Once | INTRAMUSCULAR | Status: DC | PRN

## 2015-02-28 MED ORDER — FENTANYL CITRATE (PF) 100 MCG/2ML IJ SOLN: 25.0000 ug | INTRAMUSCULAR | Status: DC | PRN

## 2015-02-28 MED ORDER — KCL IN DEXTROSE-NACL 20-5-0.9 MEQ/L-%-% IV SOLN
INTRAVENOUS | Status: DC
  Administered 2015-02-28 (×2): via INTRAVENOUS
  Filled 2015-02-28 (×7): qty 1000

## 2015-02-28 MED ORDER — HYDROCHLOROTHIAZIDE 25 MG PO TABS
25.0000 mg | ORAL_TABLET | Freq: Every day | ORAL | Status: DC
  Administered 2015-03-01 – 2015-03-02 (×2): 25 mg via ORAL
  Filled 2015-02-28 (×2): qty 1

## 2015-02-28 MED ORDER — DIPHENHYDRAMINE HCL 12.5 MG/5ML PO ELIX
12.5000 mg | ORAL_SOLUTION | ORAL | Status: DC | PRN
  Administered 2015-03-01: 25 mg via ORAL
  Filled 2015-02-28: qty 10

## 2015-02-28 MED ORDER — MAGNESIUM HYDROXIDE 400 MG/5ML PO SUSP
30.0000 mL | Freq: Every day | ORAL | Status: DC | PRN
  Administered 2015-03-01 (×2): 30 mL via ORAL
  Filled 2015-02-28 (×2): qty 30

## 2015-02-28 MED ORDER — BUPIVACAINE HCL (PF) 0.5 % IJ SOLN
INTRAMUSCULAR | Status: DC | PRN
  Administered 2015-02-28: 3 mL

## 2015-02-28 MED ORDER — ALBUTEROL SULFATE (2.5 MG/3ML) 0.083% IN NEBU: 2.5000 mg | INHALATION_SOLUTION | RESPIRATORY_TRACT | Status: DC | PRN

## 2015-02-28 MED ORDER — PROPOFOL 500 MG/50ML IV EMUL
INTRAVENOUS | Status: DC | PRN
  Administered 2015-02-28: 100 ug/kg/min via INTRAVENOUS

## 2015-02-28 MED ORDER — BISACODYL 10 MG RE SUPP
10.0000 mg | Freq: Every day | RECTAL | Status: DC | PRN
  Administered 2015-03-02: 10 mg via RECTAL
  Filled 2015-02-28: qty 1

## 2015-02-28 MED ORDER — FLEET ENEMA 7-19 GM/118ML RE ENEM: 1.0000 | ENEMA | Freq: Once | RECTAL | Status: DC | PRN

## 2015-02-28 MED ORDER — HYDROMORPHONE HCL 1 MG/ML IJ SOLN
1.0000 mg | INTRAMUSCULAR | Status: DC | PRN
  Administered 2015-02-28 (×3): 1 mg via INTRAVENOUS
  Filled 2015-02-28 (×4): qty 1

## 2015-02-28 MED ORDER — LACTATED RINGERS IV SOLN
INTRAVENOUS | Status: DC
  Administered 2015-02-28 (×3): via INTRAVENOUS

## 2015-02-28 MED ORDER — PANTOPRAZOLE SODIUM 40 MG PO TBEC
40.0000 mg | DELAYED_RELEASE_TABLET | Freq: Every day | ORAL | Status: DC
  Administered 2015-03-01 – 2015-03-02 (×2): 40 mg via ORAL
  Filled 2015-02-28 (×2): qty 1

## 2015-02-28 MED ORDER — ACETAMINOPHEN 325 MG PO TABS
650.0000 mg | ORAL_TABLET | Freq: Four times a day (QID) | ORAL | Status: DC | PRN
  Administered 2015-03-01: 650 mg via ORAL
  Filled 2015-02-28 (×3): qty 2

## 2015-02-28 MED ORDER — OXYCODONE HCL 5 MG PO TABS
5.0000 mg | ORAL_TABLET | ORAL | Status: DC | PRN
  Administered 2015-02-28 – 2015-03-02 (×10): 10 mg via ORAL
  Filled 2015-02-28 (×10): qty 2

## 2015-02-28 MED ORDER — ACETAMINOPHEN 650 MG RE SUPP
650.0000 mg | Freq: Four times a day (QID) | RECTAL | Status: DC | PRN
Start: 2015-02-28 — End: 2015-03-02

## 2015-02-28 MED ORDER — METOCLOPRAMIDE HCL 5 MG/ML IJ SOLN: 5.0000 mg | Freq: Three times a day (TID) | INTRAMUSCULAR | Status: DC | PRN

## 2015-02-28 MED ORDER — KETOROLAC TROMETHAMINE 30 MG/ML IJ SOLN
INTRAMUSCULAR | Status: AC
  Filled 2015-02-28: qty 1

## 2015-02-28 MED ORDER — FAMOTIDINE 20 MG PO TABS
20.0000 mg | ORAL_TABLET | Freq: Once | ORAL | Status: AC
  Administered 2015-02-28: 20 mg via ORAL

## 2015-02-28 MED ORDER — LABETALOL HCL 100 MG PO TABS
100.0000 mg | ORAL_TABLET | Freq: Two times a day (BID) | ORAL | Status: DC
  Administered 2015-02-28 – 2015-03-02 (×4): 100 mg via ORAL
  Filled 2015-02-28 (×4): qty 1

## 2015-02-28 MED ORDER — DOCUSATE SODIUM 100 MG PO CAPS
100.0000 mg | ORAL_CAPSULE | Freq: Two times a day (BID) | ORAL | Status: DC
  Administered 2015-02-28 – 2015-03-02 (×5): 100 mg via ORAL
  Filled 2015-02-28 (×6): qty 1

## 2015-02-28 MED ORDER — ACETAMINOPHEN 500 MG PO TABS
1000.0000 mg | ORAL_TABLET | Freq: Four times a day (QID) | ORAL | Status: AC
  Administered 2015-02-28 – 2015-03-01 (×4): 1000 mg via ORAL
  Filled 2015-02-28 (×4): qty 2

## 2015-02-28 MED ORDER — BUDESONIDE 0.5 MG/2ML IN SUSP
0.5000 mg | Freq: Two times a day (BID) | RESPIRATORY_TRACT | Status: DC
  Administered 2015-02-28 – 2015-03-02 (×3): 0.5 mg via RESPIRATORY_TRACT
  Filled 2015-02-28 (×3): qty 2

## 2015-02-28 MED ORDER — TRANEXAMIC ACID 1000 MG/10ML IV SOLN
INTRAVENOUS | Status: AC
  Filled 2015-02-28: qty 10

## 2015-02-28 MED ORDER — VANCOMYCIN HCL 10 G IV SOLR
1500.0000 mg | Freq: Two times a day (BID) | INTRAVENOUS | Status: AC
  Administered 2015-02-28: 1500 mg via INTRAVENOUS
  Filled 2015-02-28: qty 1500

## 2015-02-28 MED ORDER — KETOROLAC TROMETHAMINE 30 MG/ML IJ SOLN
30.0000 mg | Freq: Once | INTRAMUSCULAR | Status: AC
  Administered 2015-02-28: 30 mg via INTRAVENOUS

## 2015-02-28 MED ORDER — AMLODIPINE BESYLATE 10 MG PO TABS
10.0000 mg | ORAL_TABLET | Freq: Every day | ORAL | Status: DC
  Administered 2015-03-01 – 2015-03-02 (×2): 10 mg via ORAL
  Filled 2015-02-28 (×2): qty 1

## 2015-02-28 MED ORDER — MIDAZOLAM HCL 2 MG/2ML IJ SOLN
INTRAMUSCULAR | Status: DC | PRN
  Administered 2015-02-28: 2 mg via INTRAVENOUS

## 2015-02-28 MED ORDER — ALBUTEROL SULFATE HFA 108 (90 BASE) MCG/ACT IN AERS: 1.0000 | INHALATION_SPRAY | RESPIRATORY_TRACT | Status: DC | PRN

## 2015-02-28 MED ORDER — KETOROLAC TROMETHAMINE 15 MG/ML IJ SOLN
15.0000 mg | Freq: Four times a day (QID) | INTRAMUSCULAR | Status: AC
  Administered 2015-02-28 – 2015-03-01 (×6): 15 mg via INTRAVENOUS
  Filled 2015-02-28 (×6): qty 1

## 2015-02-28 MED ORDER — EPINEPHRINE HCL 0.1 MG/ML IJ SOSY
PREFILLED_SYRINGE | INTRAMUSCULAR | Status: DC | PRN
  Administered 2015-02-28: .2 mL via INTRAVENOUS

## 2015-02-28 MED ORDER — ENOXAPARIN SODIUM 30 MG/0.3ML ~~LOC~~ SOLN
30.0000 mg | Freq: Two times a day (BID) | SUBCUTANEOUS | Status: DC
  Administered 2015-02-28 – 2015-03-02 (×4): 30 mg via SUBCUTANEOUS
  Filled 2015-02-28 (×4): qty 0.3

## 2015-02-28 MED ORDER — NEOMYCIN-POLYMYXIN B GU 40-200000 IR SOLN
Status: DC | PRN
  Administered 2015-02-28: 16 mL

## 2015-02-28 SURGICAL SUPPLY — 54 items
BAG COUNTER SPONGE EZ (MISCELLANEOUS) IMPLANT
BLADE SAW SAG 25X90X1.19 (BLADE) ×6 IMPLANT
BLADE SURG SZ20 CARB STEEL (BLADE) ×3 IMPLANT
BNDG COHESIVE 6X5 TAN STRL LF (GAUZE/BANDAGES/DRESSINGS) ×3 IMPLANT
BONE CEMENT PALACOSE (Orthopedic Implant) ×9 IMPLANT
BOWL CEMENT MIX W SPATULA BONE (MISCELLANEOUS) ×3 IMPLANT
BOWL CEMENT MIX W/ADAPTER (MISCELLANEOUS) ×3 IMPLANT
CANISTER SUCT 1200ML W/VALVE (MISCELLANEOUS) ×3 IMPLANT
CANISTER SUCT 3000ML (MISCELLANEOUS) ×3 IMPLANT
CAPT KNEE TOTAL 3 ×3 IMPLANT
CATH TRAY METER 16FR LF (MISCELLANEOUS) ×3 IMPLANT
CEMENT BONE PALACOSE (Orthopedic Implant) ×3 IMPLANT
CHLORAPREP W/TINT 26ML (MISCELLANEOUS) ×3 IMPLANT
COOLER POLAR GLACIER W/PUMP (MISCELLANEOUS) ×3 IMPLANT
COUNTER SPONGE BAG EZ (MISCELLANEOUS)
COVER MAYO STAND STRL (DRAPES) ×3 IMPLANT
DECANTER SPIKE VIAL GLASS SM (MISCELLANEOUS) ×9 IMPLANT
DRAPE INCISE IOBAN 66X45 STRL (DRAPES) ×6 IMPLANT
DRSG OPSITE POSTOP 4X12 (GAUZE/BANDAGES/DRESSINGS) ×3 IMPLANT
DRSG OPSITE POSTOP 4X14 (GAUZE/BANDAGES/DRESSINGS) IMPLANT
ELECT CAUTERY BLADE 6.4 (BLADE) ×3 IMPLANT
ELECT REM PT RETURN 9FT ADLT (ELECTROSURGICAL) ×3
ELECTRODE REM PT RTRN 9FT ADLT (ELECTROSURGICAL) ×1 IMPLANT
GLOVE BIO SURGEON STRL SZ7.5 (GLOVE) ×6 IMPLANT
GLOVE BIO SURGEON STRL SZ8 (GLOVE) ×6 IMPLANT
GLOVE BIOGEL PI IND STRL 8 (GLOVE) ×1 IMPLANT
GLOVE BIOGEL PI INDICATOR 8 (GLOVE) ×2
GLOVE INDICATOR 8.0 STRL GRN (GLOVE) ×3 IMPLANT
GOWN STRL REUS W/ TWL LRG LVL3 (GOWN DISPOSABLE) ×2 IMPLANT
GOWN STRL REUS W/ TWL XL LVL3 (GOWN DISPOSABLE) ×1 IMPLANT
GOWN STRL REUS W/TWL LRG LVL3 (GOWN DISPOSABLE) ×4
GOWN STRL REUS W/TWL XL LVL3 (GOWN DISPOSABLE) ×2
HANDPIECE SUCTION TUBG SURGILV (MISCELLANEOUS) ×3 IMPLANT
HOOD PEEL AWAY FLYTE STAYCOOL (MISCELLANEOUS) ×9 IMPLANT
IMMBOLIZER KNEE 19 BLUE UNIV (SOFTGOODS) ×3 IMPLANT
KIT RM TURNOVER STRD PROC AR (KITS) ×3 IMPLANT
NDL SAFETY 18GX1.5 (NEEDLE) ×3 IMPLANT
NEEDLE 18GX1X1/2 (RX/OR ONLY) (NEEDLE) ×3 IMPLANT
NEEDLE SPNL 20GX3.5 QUINCKE YW (NEEDLE) ×3 IMPLANT
NS IRRIG 1000ML POUR BTL (IV SOLUTION) ×3 IMPLANT
PACK TOTAL KNEE (MISCELLANEOUS) ×3 IMPLANT
PAD WRAPON POLAR KNEE (MISCELLANEOUS) ×1 IMPLANT
SOL .9 NS 3000ML IRR  AL (IV SOLUTION) ×2
SOL .9 NS 3000ML IRR UROMATIC (IV SOLUTION) ×1 IMPLANT
STAPLER SKIN PROX 35W (STAPLE) ×3 IMPLANT
SUCTION FRAZIER HANDLE 10FR (MISCELLANEOUS) ×2
SUCTION TUBE FRAZIER 10FR DISP (MISCELLANEOUS) ×1 IMPLANT
SUT VIC AB 0 CT1 36 (SUTURE) ×15 IMPLANT
SUT VIC AB 2-0 CT1 27 (SUTURE) ×10
SUT VIC AB 2-0 CT1 TAPERPNT 27 (SUTURE) ×5 IMPLANT
SYR 20CC LL (SYRINGE) IMPLANT
SYR 30ML LL (SYRINGE) ×9 IMPLANT
SYRINGE 10CC LL (SYRINGE) ×3 IMPLANT
WRAPON POLAR PAD KNEE (MISCELLANEOUS) ×3

## 2015-02-28 NOTE — Anesthesia Procedure Notes (Signed)
Spinal Patient location during procedure: OR Start time: 02/28/2015 7:17 AM End time: 02/28/2015 7:29 AM Staffing Anesthesiologist: Molli Barrows Resident/CRNA: Sinda Du Performed by: resident/CRNA  Preanesthetic Checklist Completed: patient identified, site marked, surgical consent, pre-op evaluation, timeout performed, IV checked, risks and benefits discussed and monitors and equipment checked Spinal Block Patient position: sitting Prep: Betadine Patient monitoring: heart rate, continuous pulse ox, blood pressure and cardiac monitor Approach: midline Location: L3-4 Injection technique: single-shot Needle Needle type: Whitacre and Introducer  Needle gauge: 24 G Needle length: 9 cm Assessment Sensory level: T6 Additional Notes Negative paresthesia. Negative blood return. Positive free-flowing CSF. Expiration date of kit checked and confirmed. Patient tolerated procedure well, without complications.

## 2015-02-28 NOTE — Anesthesia Postprocedure Evaluation (Incomplete)
Anesthesia Post Note  Patient: Michelle Dalton  Procedure(s) Performed: Procedure(s) (LRB): TOTAL KNEE ARTHROPLASTY (Right)  Anesthesia Post Evaluation  Last Vitals:  Filed Vitals:   02/28/15 0616  BP: 149/79  Pulse: 89  Temp: 37 C  Resp: 16    Last Pain:  Filed Vitals:   02/28/15 0622  PainSc: 7                  Deveion Denz,  Latricia Heft

## 2015-02-28 NOTE — Evaluation (Signed)
Physical Therapy Evaluation Patient Details Name: Michelle Dalton MRN: [id number] DOB: 12-Aug-1960 Today's Date: 02/28/2015   History of Present Illness  Pt admitted for R TKR and evaluated on POD 0. Pt with history of L TKR last year.   Clinical Impression  Pt is a pleasant 55 year old female who was admitted for L TKR. Pt able to perform 10 SLRs with cga, therefore does not require KI at this time. Pt performs bed mobility, transfers, and ambulation with min assist and rw. Pt demonstrates deficits with pain/strength/mobility. Would benefit from skilled PT to address above deficits and promote optimal return to PLOF; recommend transition to STR upon discharge from acute hospitalization.       Follow Up Recommendations SNF    Equipment Recommendations       Recommendations for Other Services       Precautions / Restrictions Precautions Precautions: Knee;Fall Precaution Booklet Issued: No Restrictions Weight Bearing Restrictions: Yes RLE Weight Bearing: Weight bearing as tolerated      Mobility  Bed Mobility Overal bed mobility: Needs Assistance Bed Mobility: Supine to Sit     Supine to sit: Min assist     General bed mobility comments: assistance for bed mobility including cues for sequencing and assist for sliding R LE off bed.  Transfers Overall transfer level: Needs assistance Equipment used: Rolling walker (2 wheeled) Transfers: Sit to/from Stand Sit to Stand: Min assist         General transfer comment: assist for transfer with use of RW. Safe technique performed. Heavy WB through B UE and L LE, however with cues is able to weight bear on R LE  Ambulation/Gait Ambulation/Gait assistance: Min assist Ambulation Distance (Feet): 3 Feet Assistive device: Rolling walker (2 wheeled) Gait Pattern/deviations: Step-to pattern     General Gait Details: ambulated to recliner with slow step to gait pattern. Pt needs cues for sequencing and for weight bearing on R  LE. Pt with heavy guarding away from R LE.  Stairs            Wheelchair Mobility    Modified Rankin (Stroke Patients Only)       Balance Overall balance assessment: Needs assistance Sitting-balance support: Feet supported Sitting balance-Leahy Scale: Normal     Standing balance support: Bilateral upper extremity supported Standing balance-Leahy Scale: Fair                               Pertinent Vitals/Pain Pain Assessment: 0-10 Pain Score: 6  Pain Location: R knee Pain Descriptors / Indicators: Operative site guarding Pain Intervention(s): Limited activity within patient's tolerance;Ice applied;Patient requesting pain meds-RN notified;Premedicated before session    Home Living Family/patient expects to be discharged to:: Private residence Living Arrangements: Alone Available Help at Discharge:  (reports she may be able to hire a sitter?) Type of Home: House Home Access: Level entry     [place]: One [place]: [place] - 2 wheels;Bedside commode      Prior Function Level of Independence: Independent with assistive device(s)         Comments: was using rw previously     Hand Dominance        Extremity/Trunk Assessment   Upper Extremity Assessment: Overall WFL for tasks assessed           Lower Extremity Assessment: Generalized weakness (R LE grossly 3/5)         Communication   Communication:  No difficulties  Cognition Arousal/Alertness: Awake/alert Behavior During Therapy: WFL for tasks assessed/performed Overall Cognitive Status: Within Functional Limits for tasks assessed                      General Comments      Exercises Total Joint Exercises Goniometric ROM: R knee AROM: 20-50 degrees and heavily guarded by pain Other Exercises Other Exercises: Pt performed supine ther-ex including R LE ankle pumps, quad sets, SLRs, glut sets, and hip abd/add. All ther-ex with min/mod assist x 10 reps. Cues  given for correct technique      Assessment/Plan    PT Assessment Patient needs continued PT services  PT Diagnosis Acute pain;Abnormality of gait;Generalized weakness;Difficulty walking   PT Problem List Decreased strength;Decreased range of motion;Decreased activity tolerance;Decreased mobility;Decreased knowledge of use of DME;Pain  PT Treatment Interventions DME instruction;Gait training;Therapeutic exercise   PT Goals (Current goals can be found in the Care Plan section) Acute Rehab PT Goals Patient Stated Goal: to get stronger PT Goal Formulation: With patient Time For Goal Achievement: 03/14/15 Potential to Achieve Goals: Good    Frequency BID   Barriers to discharge Decreased caregiver support      Co-evaluation               End of Session Equipment Utilized During Treatment: Gait belt Activity Tolerance: Patient limited by pain Patient left: in chair;with chair alarm set Nurse Communication: Mobility status         Time: 1430-1505 PT Time Calculation (min) (ACUTE ONLY): 35 min   Charges:   PT Evaluation $PT Eval Moderate Complexity: 1 Procedure PT Treatments $Therapeutic Exercise: 8-22 mins   PT G Codes:        Michelle Dalton 30-Mar-2015, 4:50 PM  Greggory Stallion, PT, DPT [phone]

## 2015-02-28 NOTE — Anesthesia Preprocedure Evaluation (Signed)
Anesthesia Evaluation  Patient identified by MRN, date of birth, ID band Patient awake    Reviewed: Allergy & Precautions, H&P , NPO status , Patient's Chart, lab work & pertinent test results, reviewed documented beta blocker date and time   Airway Mallampati: II   Neck ROM: full    Dental  (+) Poor Dentition, Upper Dentures, Lower Dentures   Pulmonary neg pulmonary ROS, asthma ,    Pulmonary exam normal        Cardiovascular Exercise Tolerance: Poor hypertension, negative cardio ROS Normal cardiovascular exam     Neuro/Psych negative neurological ROS  negative psych ROS   GI/Hepatic negative GI ROS, Neg liver ROS,   Endo/Other  negative endocrine ROS  Renal/GU negative Renal ROS  negative genitourinary   Musculoskeletal   Abdominal   Peds  Hematology negative hematology ROS (+)   Anesthesia Other Findings Past Medical History:   Hypertension                                                 Asthma                                                       Seasonal allergies                                           Arthritis                                                  Past Surgical History:   ABDOMINAL HYSTERECTOMY                                        ANKLE FRACTURE SURGERY                          Left              JOINT REPLACEMENT                               Left                Comment:knee   TUBAL LIGATION                                              BMI    Body Mass Index   36.01 kg/m 2     Reproductive/Obstetrics negative OB ROS                             Anesthesia Physical Anesthesia Plan  ASA: III  Anesthesia Plan: General and  Spinal   Post-op Pain Management:    Induction:   Airway Management Planned:   Additional Equipment:   Intra-op Plan:   Post-operative Plan:   Informed Consent: I have reviewed the patients History and Physical, chart, labs and  discussed the procedure including the risks, benefits and alternatives for the proposed anesthesia with the patient or authorized representative who has indicated his/her understanding and acceptance.   Dental Advisory Given  Plan Discussed with: CRNA  Anesthesia Plan Comments:         Anesthesia Quick Evaluation

## 2015-02-28 NOTE — Progress Notes (Signed)
Xray of right knee done in PACU

## 2015-02-28 NOTE — Op Note (Signed)
02/28/2015  10:06 AM  Patient:   Michelle Dalton  Pre-Op Diagnosis:   Degenerative joint disease, right knee.  Post-Op Diagnosis:   Same  Procedure:   Right TKA using an all-cemented [employer] Vanguard system with a 67.5 mm mm PCR femur, a 75 mm tibial tray with a 10 mm E-poly insert, and a 34 x 9 mm all-poly 3-pegged domed patella.  Surgeon:   Pascal Lux, MD  Assistant:   Cameron Proud, PA-C   Anesthesia:   Spinal  Findings:   As above  Complications:   None  EBL:   25 cc  Fluids:   1200 cc crystalloid  UOP:   100 cc  TT:   105 minutes at 300 mmHg  Drains:   None  Closure:   Staples  Implants:   As above  Brief Clinical Note:   The patient is a 55 year old female with a long history of progressively worsening right knee pain. The patient's symptoms have progressed despite medications, activity modification, injections, etc. The patient's history and examination were consistent with advanced degenerative joint disease of the right knee confirmed by plain radiographs. The patient presents at this time for a right total knee arthroplasty.  Procedure:   The patient was brought into the operating room. After adequate spinal anesthesia was obtained, the patient was lain in the supine position. A Foley catheter was placed by the nurse before the right lower extremity was prepped with ChloraPrep solution and draped sterilely. Preoperative antibiotics were administered. After verifying the proper laterality with a surgical timeout, the limb was exsanguinated with an Esmarch and the tourniquet inflated to 300 mmHg. A standard anterior approach to the knee was made through an approximately 7 inch incision. The incision was carried down through the subcutaneous tissues to expose superficial retinaculum. This was split the length of the incision and the medial flap elevated sufficiently to expose the medial retinaculum. The medial retinaculum was incised, leaving a 3-4 mm cuff of tissue on  the patella. This was extended distally along the medial border of the patellar tendon and proximally through the medial third of the quadriceps tendon. A subtotal fat pad excision was performed before the soft tissues were elevated off the anteromedial and anterolateral aspects of the proximal tibia to the level of the collateral ligaments. The anterior portions of the medial and lateral menisci were removed, as was the anterior cruciate ligament. With the knee flexed to 90, the external tibial guide was positioned and the appropriate proximal tibial cut made. This piece was taken to the back table where it was measured and found to be optimally replicated by a 75 mm component.  Attention was directed to the distal femur. The intramedullary canal was accessed through a 3/8" drill hole. The intramedullary guide was inserted and position in order to obtain a neutral flexion gap. The intercondylar block was positioned with care taken to avoid notching the anterior cortex of the femur. The appropriate cut was made. Next, the distal cutting block was placed at 6 of valgus alignment. Using the 9 mm slot, the distal cut was made. The distal femur was measured and found to be optimally replicated by the XX123456 mm component. The 67.5 mm 4-in-1 cutting block was positioned and first the posterior, then the posterior chamfer, the anterior chamfer, femoral and intercondylar cuts were made. At this point, the posterior portions medial and lateral menisci were removed. A trial reduction was performed using the appropriate femoral and tibial components with the  10 mm insert. This demonstrated excellent stability to varus and valgus stressing both in flexion and extension while permitting full extension. Patella tracking was assessed and found to be excellent. Therefore, the tibial guide position was marked on the proximal tibia. The patella thickness was measured and found to be 20 mm. Therefore, the appropriate cut was made.  The surfaces were measured and found to be optimally replicated by the 34 mm component. The three peg holes were drilled in place before the trial button was inserted. Patella tracking was assessed and found to be excellent, passing the "no thumb test". The lug holes were drilled into the distal femur before the trial component was removed, leaving only the tibial tray. The keel was then created using the appropriate tower, reamer, and punch.  The bony surfaces were prepared for cementing by irrigating thoroughly with bacitracin saline solution. A bone plug was fashioned from some of the bone that had been removed previously and used to plug the distal femoral canal. In addition, 20 cc of Exparel diluted out to 60 cc with normal saline and 30 cc of 0.5% Sensorcaine were injected into the postero-medial and postero-lateral aspects of the knee, the medial and lateral gutter regions, and the peri-incisional tissues to help with postoperative analgesia. Meanwhile, the cement was being mixed on the back table. When it was ready, the tibial tray was cemented in first. The excess cement was removed using [employer] fast streamer. Next, the femoral component was impacted into place. Again, the excess cement was removed using [employer] fast streamer. The 10 mm trial insert was positioned and the knee brought into extension while the cement hardened. Finally, the patella was cemented into place and secured using the patellar clamp. Again, the excess cement was removed using [employer] fast streamer. Once the cement had hardened, the knee was placed through a range of motion with the findings as described above. Therefore, the trial insert was removed and, after verifying that no cement had been retained posteriorly, the permanent insert was positioned and secured using the appropriate key locking mechanism. Again the knee was placed through a range of motion with the findings as described above.  The wound was copiously irrigated with bacitracin  saline solution using the jet lavage system before the quadriceps tendon and retinacular layer were reapproximated using #0 Vicryl interrupted sutures. The superficial retinacular layer was closed using 2-0 Vicryl interrupted sutures in several layers for the skin was closed using staples. A sterile honeycomb dressing was applied to the skin before the leg was wrapped with an Ace wrap to accommodate the polar pack. The patient was then awakened and returned to the recovery room in satisfactory condition after tolerating the procedure well.

## 2015-02-28 NOTE — Care Management Note (Signed)
Case Management Note  Patient Details  Name: Michelle Dalton MRN: [id number] Date of Birth: [birth year]  Subjective/Objective:     55yo Michelle Dalton received a right TKA on 02/28/15 by Dr Roland Rack. Lives alone and reports that her children work and she has no one to stay with her after this hospitalization. PCP=Dr Linthavong. Pharmacy= [facility] on [address]. Home equipment includes a rolling walker and a BSC. No home oxygen. No current home health services. Attempted to discuss discharge planning and Michelle Dalton is adamant that she wants to go to rehab. [facility]-PT recommendation is pending. Case management will follow for discharge planning.                Action/Plan:   Expected Discharge Date:                  Expected Discharge Plan:     In-House Referral:     Discharge planning Services     Post Acute Care Choice:    Choice offered to:     DME Arranged:    DME Agency:     HH Arranged:    [place] Agency:     Status of Service:     Medicare Important Message Given:    Date Medicare IM Given:    Medicare IM give by:    Date Additional Medicare IM Given:    Additional Medicare Important Message give by:     If discussed at [place] of Stay Meetings, dates discussed:    Additional Comments:  Kaeleen Odom A, RN 02/28/2015, 11:55 AM

## 2015-02-28 NOTE — Progress Notes (Signed)
Pt up oob this afternoon with PT. Stayed up for about 2 hours. Tried to place CPM as ordered but there is no wool for to place on machine. Information passed on to next shift.

## 2015-02-28 NOTE — NC FL2 (Signed)
[place] LEVEL OF CARE SCREENING TOOL     IDENTIFICATION  Patient Name: Michelle Dalton Birthdate: 1960-07-28 Sex: female Admission Date (Current Location): 02/28/2015  [place] and [place] Number:  Engineering geologist and Address:  [facility], [address] [place], [place] [address]      Provider Number: [id number]  Attending Physician Name and Address:  Corky Mull, MD  Relative Name and Phone Number:       Current Level of Care: Hospital Recommended Level of Care: [place] Prior Approval Number:    Date Approved/Denied:   PASRR Number:    Discharge Plan: SNF    Current Diagnoses: Patient Active Problem List   Diagnosis Date Noted  . 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/28/2015   Hypertension    Asthma without status asthmaticus    Depression, unspecified    Anxiety, unspecified    History of arthritis    History of osteoarthritis       Orientation RESPIRATION BLADDER Height & Weight     Self, Time, Situation, Place  O2 (Nasal Cannula 2 (L/min)) Continent Weight: 239 lb 12.8 oz (108.773 kg) Height:  5\' 7"  (170.2 cm)  BEHAVIORAL SYMPTOMS/MOOD NEUROLOGICAL BOWEL NUTRITION STATUS   (None)  (None) Continent Diet  AMBULATORY STATUS COMMUNICATION OF NEEDS Skin   Extensive Assist Verbally Other (Comment) (Incision Closed- Right Knee)                       Personal Care Assistance Level of Assistance  Bathing, Feeding, Dressing Bathing Assistance: Limited assistance Feeding assistance: Independent Dressing Assistance: Limited assistance     Functional Limitations Info  Sight, Hearing, Speech Sight Info: Adequate Hearing Info: Adequate Speech Info: Adequate    SPECIAL CARE FACTORS FREQUENCY  PT (By licensed PT)     PT Frequency:  (5)              Contractures      Additional Factors Info  Code Status, Allergies, Isolation Precautions Code Status Info:   (Full Code) Allergies Info:  (Lisinopril)     Isolation Precautions Info:  (Contact precautions- MRSA )     Current Medications (02/28/2015):  This is the current hospital active medication list Current Facility-Administered Medications  Medication Dose Route Frequency Provider Last Rate Last Dose  . acetaminophen (TYLENOL) tablet 650 mg  650 mg Oral Q6H PRN Corky Mull, MD       Or  . acetaminophen (TYLENOL) suppository 650 mg  650 mg Rectal Q6H PRN Corky Mull, MD      . acetaminophen (TYLENOL) tablet 1,000 mg  1,000 mg Oral 4 times per day Corky Mull, MD   1,000 mg at 02/28/15 1159  . albuterol (PROVENTIL) (2.5 MG/3ML) 0.083% nebulizer solution 2.5 mg  2.5 mg Nebulization Q4H PRN Corky Mull, MD      . amitriptyline (ELAVIL) tablet 25 mg  25 mg Oral QHS Corky Mull, MD      . Derrill Memo ON 03/01/2015] amLODipine (NORVASC) tablet 10 mg  10 mg Oral Daily Corky Mull, MD      . bisacodyl (DULCOLAX) suppository 10 mg  10 mg Rectal Daily PRN Corky Mull, MD      . budesonide (PULMICORT) nebulizer solution 0.5 mg  0.5 mg Nebulization BID Corky Mull, MD      . dextrose 5 % and 0.9 % NaCl with KCl 20 mEq/L infusion  Intravenous Continuous Corky Mull, MD 100 mL/hr at 02/28/15 1159    . diphenhydrAMINE (BENADRYL) 12.5 MG/5ML elixir 12.5-25 mg  12.5-25 mg Oral Q4H PRN Corky Mull, MD      . docusate sodium (COLACE) capsule 100 mg  100 mg Oral BID Corky Mull, MD   100 mg at 02/28/15 1159  . enoxaparin (LOVENOX) injection 30 mg  30 mg Subcutaneous Q12H Corky Mull, MD      . hydrALAZINE (APRESOLINE) tablet 10 mg  10 mg Oral TID Corky Mull, MD   10 mg at 02/28/15 1142  . [START ON 03/01/2015] hydrochlorothiazide (HYDRODIURIL) tablet 25 mg  25 mg Oral Daily Corky Mull, MD      . HYDROmorphone (DILAUDID) injection 1-2 mg  1-2 mg Intravenous Q2H PRN Corky Mull, MD      . ketorolac (TORADOL) 15 MG/ML injection 15 mg  15 mg Intravenous 4 times per day Corky Mull, MD   15 mg at 02/28/15  1159  . ketorolac (TORADOL) 30 MG/ML injection           . labetalol (NORMODYNE) tablet 100 mg  100 mg Oral BID Corky Mull, MD      . magnesium hydroxide (MILK OF MAGNESIA) suspension 30 mL  30 mL Oral Daily PRN Corky Mull, MD      . metoCLOPramide (REGLAN) tablet 5-10 mg  5-10 mg Oral Q8H PRN Corky Mull, MD       Or  . metoCLOPramide (REGLAN) injection 5-10 mg  5-10 mg Intravenous Q8H PRN Corky Mull, MD      . Derrill Memo ON 03/01/2015] montelukast (SINGULAIR) tablet 10 mg  10 mg Oral QHS Corky Mull, MD      . ondansetron [facility]) tablet 4 mg  4 mg Oral Q6H PRN Corky Mull, MD       Or  . ondansetron [facility]) injection 4 mg  4 mg Intravenous Q6H PRN Corky Mull, MD      . oxyCODONE (Oxy IR/ROXICODONE) immediate release tablet 5-10 mg  5-10 mg Oral Q3H PRN Corky Mull, MD      . pantoprazole (PROTONIX) EC tablet 40 mg  40 mg Oral Daily Corky Mull, MD   40 mg at 02/28/15 1151  . sodium phosphate (FLEET) 7-19 GM/118ML enema 1 enema  1 enema Rectal Once PRN Corky Mull, MD      . vancomycin (VANCOCIN) 1,500 mg in sodium chloride 0.9 % 500 mL IVPB  1,500 mg Intravenous Q12H Corky Mull, MD         Discharge Medications: Please see discharge summary for a list of discharge medications.  Relevant Imaging Results:  Relevant Lab Results:   Additional Information  (SSN [id number])  Lorenso Quarry Sunkins, LCSW

## 2015-02-28 NOTE — Anesthesia Postprocedure Evaluation (Signed)
Anesthesia Post Note  Patient: Michelle Dalton  Procedure(s) Performed: Procedure(s) (LRB): TOTAL KNEE ARTHROPLASTY (Right)  Patient location during evaluation: PACU Anesthesia Type: General Level of consciousness: awake and alert Pain management: pain level controlled Vital Signs Assessment: post-procedure vital signs reviewed and stable Respiratory status: spontaneous breathing, nonlabored ventilation, respiratory function stable and patient connected to nasal cannula oxygen Cardiovascular status: blood pressure returned to baseline and stable Postop Assessment: no signs of nausea or vomiting Anesthetic complications: no    Last Vitals:  Filed Vitals:   02/28/15 1118 02/28/15 1202  BP: 110/60 114/62  Pulse: 76 79  Temp: 36.3 C 35.6 C  Resp: 18 18    Last Pain:  Filed Vitals:   02/28/15 1202  PainSc: 0-No pain                 Molli Barrows

## 2015-02-28 NOTE — H&P (Signed)
Paper H&P to be scanned into permanent record. H&P reviewed. No changes. 

## 2015-02-28 NOTE — Transfer of Care (Signed)
Immediate Anesthesia Transfer of Care Note  Patient: Michelle Dalton  Procedure(s) Performed: Procedure(s): TOTAL KNEE ARTHROPLASTY (Right)  Patient Location: PACU  Anesthesia Type:General and Spinal  Level of Consciousness: awake, alert  and oriented  Airway & Oxygen Therapy: Patient Spontanous Breathing and Patient connected to nasal cannula oxygen  Post-op Assessment: Report given to RN and Post -op Vital signs reviewed and stable  Post vital signs: Reviewed and stable  Last Vitals:  Filed Vitals:   02/28/15 0616  BP: 149/79  Pulse: 89  Temp: 37 C  Resp: 16    Complications: No apparent anesthesia complications

## 2015-03-01 LAB — CBC WITH DIFFERENTIAL/PLATELET
Basophils Absolute: 0.1 10*3/uL (ref 0–0.1)
Basophils Relative: 1 %
EOS PCT: 1 %
Eosinophils Absolute: 0.2 10*3/uL (ref 0–0.7)
HCT: 32.9 % — ABNORMAL LOW (ref 35.0–47.0)
Hemoglobin: 10.9 g/dL — ABNORMAL LOW (ref 12.0–16.0)
LYMPHS PCT: 14 %
Lymphs Abs: 1.5 10*3/uL (ref 1.0–3.6)
MCH: 29 pg (ref 26.0–34.0)
MCHC: 33.2 g/dL (ref 32.0–36.0)
MCV: 87.4 fL (ref 80.0–100.0)
MONO ABS: 1.3 10*3/uL — AB (ref 0.2–0.9)
Monocytes Relative: 12 %
Neutro Abs: 8.2 10*3/uL — ABNORMAL HIGH (ref 1.4–6.5)
Neutrophils Relative %: 72 %
PLATELETS: 339 10*3/uL (ref 150–440)
RBC: 3.77 MIL/uL — ABNORMAL LOW (ref 3.80–5.20)
RDW: 14.1 % (ref 11.5–14.5)
WBC: 11.3 10*3/uL — ABNORMAL HIGH (ref 3.6–11.0)

## 2015-03-01 LAB — BASIC METABOLIC PANEL
Anion gap: 6 (ref 5–15)
BUN: 7 mg/dL (ref 6–20)
CALCIUM: 8.7 mg/dL — AB (ref 8.9–10.3)
CO2: 28 mmol/L (ref 22–32)
CREATININE: 0.47 mg/dL (ref 0.44–1.00)
Chloride: 104 mmol/L (ref 101–111)
GFR calc Af Amer: >60 mL/min (ref >60)
GFR calc non Af Amer: >60 mL/min (ref >60)
GLUCOSE: 132 mg/dL — AB (ref 65–99)
Potassium: 3.5 mmol/L (ref 3.5–5.1)
Sodium: 138 mmol/L (ref 135–145)

## 2015-03-01 MED ORDER — POTASSIUM CHLORIDE CRYS ER 20 MEQ PO TBCR
20.0000 meq | EXTENDED_RELEASE_TABLET | Freq: Two times a day (BID) | ORAL | Status: DC
  Administered 2015-03-01 – 2015-03-02 (×3): 20 meq via ORAL
  Filled 2015-03-01 (×3): qty 1

## 2015-03-01 MED ORDER — CHLORHEXIDINE GLUCONATE CLOTH 2 % EX PADS
6.0000 | MEDICATED_PAD | Freq: Every day | CUTANEOUS | Status: DC
  Administered 2015-03-01 – 2015-03-02 (×2): 6 via TOPICAL

## 2015-03-01 MED ORDER — TRAMADOL HCL 50 MG PO TABS: 50.0000 mg | ORAL_TABLET | Freq: Four times a day (QID) | ORAL | Status: DC | PRN

## 2015-03-01 MED ORDER — MUPIROCIN 2 % EX OINT
1.0000 "application " | TOPICAL_OINTMENT | Freq: Two times a day (BID) | CUTANEOUS | Status: DC
  Administered 2015-03-01 – 2015-03-02 (×3): 1 via NASAL
  Filled 2015-03-01: qty 22

## 2015-03-01 NOTE — Progress Notes (Signed)
PT working with patient for am therapy. Ambulating in room using walker.

## 2015-03-01 NOTE — Progress Notes (Signed)
Physical Therapy Treatment Patient Details Name: Michelle Dalton MRN: [id number] DOB: 21-Jan-1960 Today's Date: 03/01/2015    History of Present Illness Pt admitted for R TKR and evaluated on POD 0. Pt with history of L TKR last year.     PT Comments    Pt is making good progress towards goals. Pt with increased ability to weight bear on R LE. Improved functional independence noted. Good endurance with there-ex. Pt very motivated to perform therapy.  Follow Up Recommendations  SNF     Equipment Recommendations       Recommendations for Other Services       Precautions / Restrictions Precautions Precautions: Knee;Fall Precaution Booklet Issued: No Restrictions Weight Bearing Restrictions: Yes RLE Weight Bearing: Weight bearing as tolerated    Mobility  Bed Mobility Overal bed mobility: Needs Assistance Bed Mobility: Sit to Supine     Supine to sit: Min assist Sit to supine: Min guard   General bed mobility comments: assist for bringing R LE up for bed. Once supine in bed, pt able to scoot up towards [place].  Transfers Overall transfer level: Needs assistance Equipment used: Rolling walker (2 wheeled) Transfers: Sit to/from Stand Sit to Stand: Min assist         General transfer comment: assist for upright posture. RW used for assistance. Improved technique noted  Ambulation/Gait Ambulation/Gait assistance: Min assist Ambulation Distance (Feet): 30 Feet Assistive device: Rolling walker (2 wheeled) Gait Pattern/deviations: Step-to pattern     General Gait Details: improved sequencing noted with reciprocal gait pattern. Cues for upright posture. Pt fatigues with increased distance   Stairs            Wheelchair Mobility    Modified Rankin (Stroke Patients Only)       Balance                                    Cognition Arousal/Alertness: Awake/alert Behavior During Therapy: WFL for tasks assessed/performed Overall Cognitive  Status: Within Functional Limits for tasks assessed                      Exercises Total Joint Exercises Goniometric ROM: R knee AAROM: 10-79 degrees Other Exercises Other Exercises: Pt performed supine ther-ex including R LE ankle pumps, quad sets, SLRs, hip abd/add, and SAQ.  All ther-ex performed x 12 reps with cues for correct technique and min assist    General Comments        Pertinent Vitals/Pain Pain Assessment: 0-10 Pain Score: 1  Pain Location: R knee Pain Descriptors / Indicators: Operative site guarding Pain Intervention(s): Limited activity within patient's tolerance;Ice applied    Home Living                      Prior Function            PT Goals (current goals can now be found in the care plan section) Acute Rehab PT Goals Patient Stated Goal: to get stronger PT Goal Formulation: With patient Time For Goal Achievement: 03/14/15 Potential to Achieve Goals: Good Progress towards PT goals: Progressing toward goals    Frequency  BID    PT Plan Current plan remains appropriate    Co-evaluation             End of Session Equipment Utilized During Treatment: Gait belt Activity Tolerance: Patient limited by pain Patient left:  in bed;with bed alarm set     Time: 1350-1413 PT Time Calculation (min) (ACUTE ONLY): 23 min  Charges:  $Gait Training: 8-22 mins $Therapeutic Exercise: 8-22 mins                    G Codes:      Idaliz Tinkle 03-11-15, 3:34 PM  Greggory Stallion, PT, DPT [phone]

## 2015-03-01 NOTE — Progress Notes (Signed)
Physical Therapy Treatment Patient Details Name: Michelle Dalton MRN: [id number] DOB: Jun 05, 1960 Today's Date: 03/01/2015    History of Present Illness Pt admitted for R TKR and evaluated on POD 0. Pt with history of L TKR last year.     PT Comments    Pt is making gradual progress towards goals and is motivated to work with therapy. Pt with improved AAROM this date and good endurance with there-ex. Ambulation distance limited by pain. Still unable to place full weight through R LE and demonstrates antalgic gait pattern.  Follow Up Recommendations  SNF     Equipment Recommendations       Recommendations for Other Services       Precautions / Restrictions Precautions Precautions: Knee;Fall Precaution Booklet Issued: No Restrictions Weight Bearing Restrictions: Yes RLE Weight Bearing: Weight bearing as tolerated    Mobility  Bed Mobility Overal bed mobility: Needs Assistance Bed Mobility: Supine to Sit     Supine to sit: Min assist     General bed mobility comments: assistance for bed mobility including cues for sequencing and assist for sliding R LE off bed.  Transfers Overall transfer level: Needs assistance Equipment used: Rolling walker (2 wheeled) Transfers: Sit to/from Stand Sit to Stand: Min assist         General transfer comment: assist for transfer with use of RW. Safe technique performed. Heavy WB through B UE and L LE, however with cues is able to weight bear on R LE  Ambulation/Gait Ambulation/Gait assistance: Min assist Ambulation Distance (Feet): 10 Feet Assistive device: Rolling walker (2 wheeled) Gait Pattern/deviations: Step-to pattern     General Gait Details: improved technique with sequencing using rw for ambulation. Pt with increased ability to weight bear through R LE this date. Slow gait speed noted with step to gait pattern.    Stairs            Wheelchair Mobility    Modified Rankin (Stroke Patients Only)        Balance                                    Cognition Arousal/Alertness: Awake/alert Behavior During Therapy: WFL for tasks assessed/performed Overall Cognitive Status: Within Functional Limits for tasks assessed                      Exercises Total Joint Exercises Goniometric ROM: R knee AAROM: 10-79 degrees Other Exercises Other Exercises: Pt performed supine ther-ex including R LE ankle pumps, quad sets, SLRs, hip abd/add, SAQ, and knee flexion stretches. All ther-ex performed x 12 reps with cues for correct technique and min assist    General Comments        Pertinent Vitals/Pain Pain Assessment: 0-10 Pain Score: 4  Pain Location: R knee Pain Descriptors / Indicators: Operative site guarding Pain Intervention(s): Limited activity within patient's tolerance;Premedicated before session;Ice applied    Home Living                      Prior Function            PT Goals (current goals can now be found in the care plan section) Acute Rehab PT Goals Patient Stated Goal: to get stronger PT Goal Formulation: With patient Time For Goal Achievement: 03/14/15 Potential to Achieve Goals: Good Progress towards PT goals: Progressing toward goals    Frequency  BID    PT Plan Current plan remains appropriate    Co-evaluation             End of Session Equipment Utilized During Treatment: Gait belt Activity Tolerance: Patient limited by pain Patient left: in chair;with chair alarm set     Time: 812 [id number] PT Time Calculation (min) (ACUTE ONLY): 24 min  Charges:  $Gait Training: 8-22 mins $Therapeutic Exercise: 8-22 mins                    G Codes:      Michelle Dalton 2015-03-23, 11:43 AM  Michelle Dalton, PT, DPT [phone]

## 2015-03-01 NOTE — Clinical Social Work Note (Signed)
Clinical Social Work Assessment  Patient Details  Name: Michelle Dalton MRN: [id number] Date of Birth: Nov 04, 1960  Date of referral:  03/01/15               Reason for consult:  Facility Placement                Permission sought to share information with:  Chartered certified accountant granted to share information::  Yes, Verbal Permission Granted  Name::      [place]::   [place]   Relationship::     Contact Information:     Housing/Transportation Living arrangements for the past 2 months:  [place] of Information:  Patient Patient Interpreter Needed:  None Criminal Activity/Legal Involvement Pertinent to Current Situation/Hospitalization:  No - Comment as needed Significant Relationships:  Adult Children Lives with:  Self Do you feel safe going back to the place where you live?  Yes Need for family participation in patient care:  No (Coment)  Care giving concerns:  Patient lives alone in [place].    Social Worker assessment / plan:  Holiday representative (CSW) received SNF consult. PT is recommending SNF. CSW met with patient alone at bedside. Patient was alert and oriented and sitting up eating breakfast. CSW introduced self and explained role of CSW department. Patient reported that she lives alone in [place]. Per patient she has 3 adult children that work during the day and have their own families. Patient reported that her primary support his her mother Laqueta Linden. CSW explained SNF process and that patient's insurance [facility] will have to approve SNF stay. Patient is agreeable to SNF search in [facility]   CSW presented bed offers. Patient chose [facility]. CSW left [facility] admissions coordinator at [facility] a message making him aware of accepted bed offer. CSW also contacted [place] and made her aware of above. Plan is for patient to D/C tomorrow if medically stable.  PASARR is pending. CSW faxed requested clinicals to [place] [facility]. CSW will continue to follow and assist as needed.   Employment status:  Retired Nurse, adult PT Recommendations:  [place] / Referral to community resources:  [place]  Patient/Family's Response to care:  Patient is agreeable to D/C to [facility] for [facility].   Patient/Family's Understanding of and Emotional Response to Diagnosis, Current Treatment, and Prognosis:  Patient was pleasant throughout assessment.   Emotional Assessment Appearance:  Appears stated age Attitude/Demeanor/Rapport:    Affect (typically observed):  Accepting, Adaptable, Overwhelmed Orientation:  Oriented to Self, Oriented to Place, Oriented to  Time, Oriented to Situation Alcohol / Substance use:  Not Applicable Psych involvement (Current and /or in the community):  No (Comment)  Discharge Needs  Concerns to be addressed:  Discharge Planning Concerns Readmission within the last 30 days:  No Current discharge risk:  Dependent with Mobility Barriers to Discharge:  Continued Medical Work up   Loralyn Freshwater, LCSW 03/01/2015, 8:42 AM

## 2015-03-01 NOTE — Clinical Social Work Placement (Addendum)
   CLINICAL SOCIAL WORK PLACEMENT  NOTE  Date:  03/01/2015  Patient Details  Name: Michelle Dalton MRN: [id number] Date of Birth: 1960-08-31  Clinical Social Work is seeking post-discharge placement for this patient at the [place] level of care (*CSW will initial, date and re-position this form in  chart as items are completed):  Yes   Patient/family provided with [place] Work Department's list of facilities offering this level of care within the geographic area requested by the patient (or if unable, by the patient's family).  Yes   Patient/family informed of their freedom to choose among providers that offer the needed level of care, that participate in Medicare, Medicaid or managed care program needed by the patient, have an available bed and are willing to accept the patient.  Yes   Patient/family informed of [place]'s ownership interest in [facility] and [facility], as well as of the fact that they are under no obligation to receive care at these facilities.  PASRR submitted to EDS on 02/28/15     PASRR number received on  03/01/15 ([id number] E expires 03/31/2015)        Existing PASRR number confirmed on       FL2 transmitted to all facilities in geographic area requested by pt/family on 02/28/15     FL2 transmitted to all facilities within larger geographic area on       Patient informed that his/her managed care company has contracts with or will negotiate with certain facilities, including the following:        Yes   Patient/family informed of bed offers received.  Patient chooses bed at  [facility] )     Physician recommends and patient chooses bed at      Patient to be transferred to   on  .  Patient to be transferred to facility by       Patient family notified on   of transfer.  Name of family member notified:        PHYSICIAN       Additional Comment:     _______________________________________________ Loralyn Freshwater, LCSW 03/01/2015, 8:41 AM

## 2015-03-01 NOTE — Progress Notes (Signed)
Subjective: 1 Day Post-Op Procedure(s) (LRB): TOTAL KNEE ARTHROPLASTY (Right) Patient reports pain as 3 on 0-10 scale.   Patient is well, and has had no acute complaints or problems Plan is to go Skilled nursing facility after hospital stay. Negative for chest pain and shortness of breath Fever: no Gastrointestinal:Negative for nausea and vomiting  Objective: Vital signs in last 24 hours: Temp:  [96 F (35.6 C)-98.7 F (37.1 C)] 98.7 F (37.1 C) (02/22 0326) Pulse Rate:  [76-99] 97 (02/22 0326) Resp:  [16-20] 18 (02/22 0326) BP: (107-164)/(58-82) 164/82 mmHg (02/22 0326) SpO2:  [95 %-100 %] 96 % (02/22 0326) Weight:  [108.773 kg (239 lb 12.8 oz)] 108.773 kg (239 lb 12.8 oz) (02/21 1116)  Intake/Output from previous day:  Intake/Output Summary (Last 24 hours) at 03/01/15 0731 Last data filed at 03/01/15 [id number]  Gross per 24 hour  Intake 4011.67 ml  Output   3100 ml  Net 911.67 ml    Intake/Output this shift:    Labs:  Recent Labs  02/28/15 0641 03/01/15 0352  HGB 13.6 10.9*    Recent Labs  02/28/15 0641 03/01/15 0352  WBC  --  11.3*  RBC  --  3.77*  HCT 40.0 32.9*  PLT  --  339    Recent Labs  02/28/15 0641 03/01/15 0352  NA 140 138  K 3.2* 3.5  CL  --  104  CO2  --  28  BUN  --  7  CREATININE  --  0.47  GLUCOSE 110* 132*  CALCIUM  --  8.7*   No results for input(s): LABPT, INR in the last 72 hours.   EXAM General - Patient is Alert, Appropriate and Oriented Extremity - Neurologically intact Sensation intact distally Intact pulses distally Dorsiflexion/Plantar flexion intact Incision: scant drainage No cellulitis present Compartment soft Dressing/Incision - blood tinged drainage Motor Function - intact, moving foot and toes well on exam.   Abdomen distended but soft.  Normal BS, without tympany.  Past Medical History  Diagnosis Date  . Hypertension   . Asthma   . Seasonal allergies   . Arthritis     Assessment/Plan: 1 Day  Post-Op Procedure(s) (LRB): TOTAL KNEE ARTHROPLASTY (Right) Active Problems:   Status post total knee replacement using cement  Estimated body mass index is 37.55 kg/(m^2) as calculated from the following:   Height as of this encounter: 5\' 7"  (1.702 m).   Weight as of this encounter: 108.773 kg (239 lb 12.8 oz). Advance diet Up with therapy D/C IV fluids when tolerating PO intake.  Labs and Vitals reviewed.  WBC 11.3, likely secondary to surgery.  Denies any N/V or burning with urination. Hg 10.9.  CBC and BMP ordered for tomorrow morning. Tramadol added to pain med regimen. Up with PT today, PT currently recommending SNF. Pt will need to have BM prior to discharge.  Dulcolax suppository and FLEET enema on board.  DVT Prophylaxis - Lovenox, Foot Pumps and TED hose Weight-Bearing as tolerated to right leg  J. Cameron Proud, PA-C [facility] Orthopaedic Surgery 03/01/2015, 7:31 AM

## 2015-03-01 NOTE — Care Management Important Message (Signed)
Important Message  Patient Details  Name: Michelle Dalton MRN: [id number] Date of Birth: 10-31-1960   Medicare Important Message Given:  Yes    [place] Jakubiak A, RN 03/01/2015, 8:21 AM

## 2015-03-02 ENCOUNTER — Inpatient Hospital Stay: Payer: Commercial Managed Care - HMO

## 2015-03-02 DIAGNOSIS — F329 Major depressive disorder, single episode, unspecified: Secondary | ICD-10-CM | POA: Diagnosis not present

## 2015-03-02 DIAGNOSIS — I10 Essential (primary) hypertension: Secondary | ICD-10-CM | POA: Diagnosis not present

## 2015-03-02 DIAGNOSIS — M1990 Unspecified osteoarthritis, unspecified site: Secondary | ICD-10-CM | POA: Diagnosis not present

## 2015-03-02 DIAGNOSIS — Z471 Aftercare following joint replacement surgery: Secondary | ICD-10-CM | POA: Diagnosis not present

## 2015-03-02 DIAGNOSIS — R52 Pain, unspecified: Secondary | ICD-10-CM | POA: Diagnosis not present

## 2015-03-02 DIAGNOSIS — Z96651 Presence of right artificial knee joint: Secondary | ICD-10-CM | POA: Diagnosis not present

## 2015-03-02 DIAGNOSIS — Z743 Need for continuous supervision: Secondary | ICD-10-CM | POA: Diagnosis not present

## 2015-03-02 DIAGNOSIS — R2689 Other abnormalities of gait and mobility: Secondary | ICD-10-CM | POA: Diagnosis not present

## 2015-03-02 DIAGNOSIS — R531 Weakness: Secondary | ICD-10-CM | POA: Diagnosis not present

## 2015-03-02 DIAGNOSIS — J302 Other seasonal allergic rhinitis: Secondary | ICD-10-CM | POA: Diagnosis not present

## 2015-03-02 DIAGNOSIS — J45909 Unspecified asthma, uncomplicated: Secondary | ICD-10-CM | POA: Diagnosis not present

## 2015-03-02 DIAGNOSIS — R05 Cough: Secondary | ICD-10-CM | POA: Diagnosis not present

## 2015-03-02 LAB — URINALYSIS COMPLETE WITH MICROSCOPIC (ARMC ONLY)
BILIRUBIN URINE: NEGATIVE
GLUCOSE, UA: NEGATIVE mg/dL
Ketones, ur: NEGATIVE mg/dL
NITRITE: NEGATIVE
Protein, ur: NEGATIVE mg/dL
Specific Gravity, Urine: 1.01 (ref 1.005–1.030)
pH: 5 (ref 5.0–8.0)

## 2015-03-02 LAB — CBC
HEMATOCRIT: 31.8 % — AB (ref 35.0–47.0)
HEMOGLOBIN: 10.6 g/dL — AB (ref 12.0–16.0)
MCH: 28.7 pg (ref 26.0–34.0)
MCHC: 33.2 g/dL (ref 32.0–36.0)
MCV: 86.3 fL (ref 80.0–100.0)
Platelets: 331 10*3/uL (ref 150–440)
RBC: 3.68 MIL/uL — ABNORMAL LOW (ref 3.80–5.20)
RDW: 14.7 % — AB (ref 11.5–14.5)
WBC: 13.5 10*3/uL — AB (ref 3.6–11.0)

## 2015-03-02 LAB — BASIC METABOLIC PANEL
ANION GAP: 7 (ref 5–15)
BUN: 11 mg/dL (ref 6–20)
CO2: 28 mmol/L (ref 22–32)
Calcium: 8.7 mg/dL — ABNORMAL LOW (ref 8.9–10.3)
Chloride: 101 mmol/L (ref 101–111)
Creatinine, Ser: 0.62 mg/dL (ref 0.44–1.00)
GFR calc Af Amer: >60 mL/min (ref >60)
Glucose, Bld: 112 mg/dL — ABNORMAL HIGH (ref 65–99)
POTASSIUM: 3.8 mmol/L (ref 3.5–5.1)
SODIUM: 136 mmol/L (ref 135–145)

## 2015-03-02 MED ORDER — POTASSIUM CHLORIDE CRYS ER 20 MEQ PO TBCR: 20.0000 meq | EXTENDED_RELEASE_TABLET | Freq: Every day | ORAL | Status: DC

## 2015-03-02 MED ORDER — TRAMADOL HCL 50 MG PO TABS: 50.0000 mg | ORAL_TABLET | Freq: Four times a day (QID) | ORAL | Status: DC | PRN

## 2015-03-02 MED ORDER — OXYCODONE HCL 5 MG PO TABS: 5.0000 mg | ORAL_TABLET | ORAL | Status: DC | PRN

## 2015-03-02 MED ORDER — ENOXAPARIN SODIUM 40 MG/0.4ML ~~LOC~~ SOLN: 40.0000 mg | SUBCUTANEOUS | Status: DC

## 2015-03-02 NOTE — Progress Notes (Signed)
Subjective: 2 Days Post-Op Procedure(s) (LRB): TOTAL KNEE ARTHROPLASTY (Right) Patient reports pain as 5 on 0-10 scale.   Patient is well, and has had no acute complaints or problems Plan is to go Skilled nursing facility after hospital stay.  According to Case Management note, patient has chose [facility]. Negative for chest pain and shortness of breath.  Pt is complaining of a dry cough. Fever: no Gastrointestinal:Negative for nausea and vomiting  Objective: Vital signs in last 24 hours: Temp:  [98 F (36.7 C)-99.4 F (37.4 C)] 98.7 F (37.1 C) (02/23 0737) Pulse Rate:  [81-108] 108 (02/23 0737) Resp:  [18] 18 (02/23 0737) BP: (135-164)/(74-94) 164/94 mmHg (02/23 0737) SpO2:  [95 %-100 %] 99 % (02/23 0737)  Intake/Output from previous day:  Intake/Output Summary (Last 24 hours) at 03/02/15 0746 Last data filed at 03/02/15 0352  Gross per 24 hour  Intake    465 ml  Output      0 ml  Net    465 ml    Intake/Output this shift:    Labs:  Recent Labs  02/28/15 0641 03/01/15 0352 03/02/15 0541  HGB 13.6 10.9* 10.6*    Recent Labs  03/01/15 0352 03/02/15 0541  WBC 11.3* 13.5*  RBC 3.77* 3.68*  HCT 32.9* 31.8*  PLT 339 331    Recent Labs  03/01/15 0352 03/02/15 0541  NA 138 136  K 3.5 3.8  CL 104 101  CO2 28 28  BUN 7 11  CREATININE 0.47 0.62  GLUCOSE 132* 112*  CALCIUM 8.7* 8.7*   No results for input(s): LABPT, INR in the last 72 hours.   EXAM General - Patient is Alert, Appropriate and Oriented Extremity - Neurologically intact Sensation intact distally Intact pulses distally Dorsiflexion/Plantar flexion intact Incision: scant drainage No cellulitis present Compartment soft Dressing/Incision - blood tinged drainage Motor Function - intact, moving foot and toes well on exam.   Abdomen less distended this AM and remains soft.  Normal BS, without tympany. Honeycomb dressing changed this AM, ACE Wrap removed.  Past Medical History   Diagnosis Date  . Hypertension   . Asthma   . Seasonal allergies   . Arthritis     Assessment/Plan: 2 Days Post-Op Procedure(s) (LRB): TOTAL KNEE ARTHROPLASTY (Right) Active Problems:   Status post total knee replacement using cement  Estimated body mass index is 37.55 kg/(m^2) as calculated from the following:   Height as of this encounter: 5\' 7"  (1.702 m).   Weight as of this encounter: 108.773 kg (239 lb 12.8 oz). Advance diet Up with therapy   Labs and Vitals reviewed.  WBC increased to 13.5 from 11.3.  Due to cough, will order CXR as well as UA for UTI. HR 108 this AM, pt has been receiving nebulizer treatments which can be increasing her HR.  Most recent temp 98.7 Denies any N/V or burning with urination. Hg stable at 10.6, HCT 31.8 Up with PT today, PT currently recommending SNF.  Pt has chosen a bed at [facility]. Pt reports that she had a BM this AM, will confirm with nursing staff.  DVT Prophylaxis - Lovenox, Foot Pumps and TED hose Weight-Bearing as tolerated to right leg  J. Cameron Proud, PA-C [facility] Orthopaedic Surgery 03/02/2015, 7:46 AM

## 2015-03-02 NOTE — Progress Notes (Signed)
PT Cancellation Note  Patient Details Name: Michelle Dalton MRN: [id number] DOB: 06/21/1960   Cancelled Treatment:      PT offered but declined session.  Awaiting transport to STR facility.  Chesley Noon, PTA 03/02/2015, 1:52 PM

## 2015-03-02 NOTE — Clinical Social Work Placement (Addendum)
   CLINICAL SOCIAL WORK PLACEMENT  NOTE  Date:  03/02/2015  Patient Details  Name: Michelle Dalton MRN: [id number] Date of Birth: [birth year]  Clinical Social Work is seeking post-discharge placement for this patient at the [place] level of care (*CSW will initial, date and re-position this form in  chart as items are completed):  Yes   Patient/family provided with [place] Work Department's list of facilities offering this level of care within the geographic area requested by the patient (or if unable, by the patient's family).  Yes   Patient/family informed of their freedom to choose among providers that offer the needed level of care, that participate in Medicare, Medicaid or managed care program needed by the patient, have an available bed and are willing to accept the patient.  Yes   Patient/family informed of 's ownership interest in [facility] and [facility], as well as of the fact that they are under no obligation to receive care at these facilities.  PASRR submitted to EDS on 02/28/15     PASRR number received on 03/01/15 ([id number] E expires 03/31/2015)  Existing PASRR number confirmed on       FL2 transmitted to all facilities in geographic area requested by pt/family on 02/28/15     FL2 transmitted to all facilities within larger geographic area on       Patient informed that his/her managed care company has contracts with or will negotiate with certain facilities, including the following:        Yes   Patient/family informed of bed offers received.  Patient chooses bed at  [facility] )     Physician recommends and patient chooses bed at      Patient to be transferred to  [facility] ) on 03/02/15.  Patient to be transferred to facility by  [facility] )     Patient family notified on 03/02/15 of transfer.  Name of family member notified:   (Per patient her daughter is aware of D/C today. )      PHYSICIAN       Additional Comment:    _______________________________________________ Loralyn Freshwater, LCSW 03/02/2015, 1:05 PM

## 2015-03-02 NOTE — Progress Notes (Signed)
Patient is medically stable for D/C to [facility] today. Per [facility] admissions coordinator at [facility] patient is going to room 506. [facility] authorization has been received. Auth # [id number]. Clinical Education officer, museum (CSW) sent D/C Summary, FL2 and D/C Packet to [employer] via [employer]. Patient is aware of above. Per patient her daughter is aware of D/C today. Please reconsult if future social work needs arise. CSW signing off.   Blima Rich, LCSW [phone]

## 2015-03-02 NOTE — Progress Notes (Signed)
Pt discharged to [facility]  Via ems. Report  Called to angela

## 2015-03-02 NOTE — Discharge Summary (Signed)
Physician Discharge Summary  Patient ID: Michelle Dalton MRN: [id number] DOB/AGE: [birth year] 55 y.o.  Admit date: 02/28/2015 Discharge date: 03/02/2015  Admission Diagnoses:  primary osteoarthritis  Degenerative joint disease, right knee.  Discharge Diagnoses: Patient Active Problem List   Diagnosis Date Noted  . 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/28/2015  Degenerative joint disease, right knee.  Past Medical History  Diagnosis Date  . Hypertension   . Asthma   . Seasonal allergies   . Arthritis      Transfusion: None   Consultants (if any):    Discharged Condition: Improved  Hospital Course: Michelle Dalton is an 55 y.o. female who was admitted 02/28/2015 with a diagnosis of degenerative joint disease of the right knee and went to the operating room on 02/28/2015 and underwent the above named procedures.   Pt had continued tachycardia with elevated WBC.  CXR negative for pneumonia, UA negative for UTI.  Pt is stable and ready for d/c   Surgeries: Procedure(s): TOTAL KNEE ARTHROPLASTY on 02/28/2015 Patient tolerated the surgery well. Taken to PACU where she was stabilized and then transferred to the orthopedic floor.  Started on Lovenox 30mg  q 12 hrs. Foot pumps applied bilaterally at 80 mm. Heels elevated on bed with rolled towels. No evidence of DVT. Negative [place]. Physical therapy started on day #1 for gait training and transfer. OT started day #1 for ADL and assisted devices.  Patient's IV , foley were d/c on POD1  Implants: Right TKA using an all-cemented [employer] Vanguard system with a 67.5 mm mm PCR femur, a 75 mm tibial tray with a 10 mm E-poly insert, and a 34 x 9 mm all-poly 3-pegged domed patella.  She was given perioperative antibiotics:  Anti-infectives    Start     Dose/Rate Route Frequency Ordered Stop   02/28/15 1900  vancomycin (VANCOCIN) 1,500 mg in sodium chloride 0.9 % 500 mL IVPB     1,500 mg 250 mL/hr over 120 Minutes Intravenous  Every 12 hours 02/28/15 1108 02/28/15 2053   02/28/15 0600  vancomycin (VANCOCIN) 1,500 mg in sodium chloride 0.9 % 500 mL IVPB     1,500 mg 250 mL/hr over 120 Minutes Intravenous  Once 02/28/15 0555 02/28/15 0847    .  She was given sequential compression devices, early ambulation, and Lovenox for DVT prophylaxis.  She benefited maximally from the hospital stay and there were no complications.    Recent vital signs:  Filed Vitals:   03/02/15 0352 03/02/15 0737  BP: 153/82 164/94  Pulse: 104 108  Temp: 99.4 F (37.4 C) 98.7 F (37.1 C)  Resp: 18 18    Recent laboratory studies:  Lab Results  Component Value Date   HGB 10.6* 03/02/2015   HGB 10.9* 03/01/2015   HGB 13.6 02/28/2015   Lab Results  Component Value Date   WBC 13.5* 03/02/2015   PLT 331 03/02/2015   Lab Results  Component Value Date   INR 0.97 02/22/2015   Lab Results  Component Value Date   NA 136 03/02/2015   K 3.8 03/02/2015   CL 101 03/02/2015   CO2 28 03/02/2015   BUN 11 03/02/2015   CREATININE 0.62 03/02/2015   GLUCOSE 112* 03/02/2015    Discharge Medications:     Medication List    TAKE these medications        amitriptyline 25 MG tablet  Commonly known as:  ELAVIL  Take 25 mg by mouth at bedtime.  amLODipine 10 MG tablet  Commonly known as:  NORVASC  Take 10 mg by mouth daily.     beclomethasone 80 MCG/ACT inhaler  Commonly known as:  QVAR  Inhale 2 puffs into the lungs 2 (two) times daily. Reported on 02/28/2015     enoxaparin 40 MG/0.4ML injection  Commonly known as:  LOVENOX  Inject 0.4 mLs (40 mg total) into the skin daily.     FLOVENT DISKUS 100 MCG/BLIST Aepb  Generic drug:  Fluticasone Propionate (Inhal)  Inhale 1 puff into the lungs 2 (two) times daily.     hydrALAZINE 10 MG tablet  Commonly known as:  APRESOLINE  Take 10 mg by mouth 3 (three) times daily.     hydrochlorothiazide 25 MG tablet  Commonly known as:  HYDRODIURIL  Take 25 mg by mouth daily.      labetalol 100 MG tablet  Commonly known as:  NORMODYNE  Take 100 mg by mouth 2 (two) times daily.     montelukast 10 MG tablet  Commonly known as:  SINGULAIR  Take 10 mg by mouth at bedtime.     oxyCODONE 5 MG immediate release tablet  Commonly known as:  Oxy IR/ROXICODONE  Take 1-2 tablets (5-10 mg total) by mouth every 4 (four) hours as needed for breakthrough pain.     traMADol 50 MG tablet  Commonly known as:  ULTRAM  Take by mouth every 8 (eight) hours as needed.     VENTOLIN HFA 108 (90 Base) MCG/ACT inhaler  Generic drug:  albuterol  Inhale 1-2 puffs into the lungs every 4 (four) hours as needed for wheezing or shortness of breath.        Diagnostic Studies: Dg Chest 2 View  02/22/2015  CLINICAL DATA:  Preoperative examination prior to knee replacement ; history of asthma and hypertension, nonsmoker. EXAM: CHEST  2 VIEW COMPARISON:  Portable chest x-ray of April 30, 2013 and chest CT scan of May 01, 2013. FINDINGS: The lungs are well-expanded. There is no focal infiltrate. There is stable calcified nodules peripherally in left mid lung measuring up to 5 mm in diameter consistent with previous granulomatous infection. The heart and pulmonary vascularity are normal. The mediastinum is normal in width. There is no pleural effusion. There is no acute bony abnormality. IMPRESSION: There is no acute cardiopulmonary abnormality. There is evidence of previous granulomatous infection. Electronically Signed   By: David  Martinique M.D.   On: 02/22/2015 14:47   Dg Chest Port 1 View  03/02/2015  CLINICAL DATA:  Cough.  Two days after total knee replacement. EXAM: PORTABLE CHEST 1 VIEW COMPARISON:  02/22/2015 FINDINGS: Full appearance of the hila, but attributed to technique given preoperative two-view chest x-ray appearance. Borderline cardiomegaly, also accentuated by technique compared to prior. Borderline hyperinflation. There is no edema, consolidation, effusion, or pneumothorax. Calcified  granulomas in the left lung. IMPRESSION: No active disease. Electronically Signed   By: Monte Fantasia M.D.   On: 03/02/2015 08:54   Dg Knee Right Port  02/28/2015  CLINICAL DATA:  Status post right knee replacement EXAM: PORTABLE RIGHT KNEE - 1-2 VIEW COMPARISON:  None. FINDINGS: Right knee replacement is seen. Air is noted in the surgical bed. No acute bony abnormality is noted. IMPRESSION: Status post right knee replacement Electronically Signed   By: Inez Catalina M.D.   On: 02/28/2015 11:11   Disposition: Pt is stable and ready for discharge to [facility].  Pt to continue Lovenox 40mg  daily for 14 days and  will continue Klor-Con 20 meq for 10 days.  Pt to follow-up in two weeks for suture removal or sutures are to be removed in 14 days at [facility].       Follow-up Information    Follow up with [facility] .   Specialty:  [place] information:   [place] [phone]      Follow up with Judson Roch, PA-C In 2 weeks.   Specialty:  Physician Assistant   Why:  For suture removal, For wound re-check   Contact information:   [place] [id number] [phone]      Signed: Judson Roch PA-C 03/02/2015, 12:16 PM

## 2015-03-02 NOTE — Discharge Instructions (Signed)

## 2015-03-02 NOTE — Progress Notes (Signed)
Physical Therapy Treatment Patient Details Name: Michelle Dalton MRN: [id number] DOB: 1960/01/10 Today's Date: 03/02/2015    History of Present Illness Pt admitted for R TKR and evaluated on POD 0. Pt with history of L TKR last year.     PT Comments    Pt with increased pain this AM (nsg aware).  Pt presented at [facility] and was able to trf from [facility] to recliner chair with minA x1 with use of RW requiring cues for PHP and sequencing.  Pt with c/o pain with most activities during therex.  PTA performed gentle oscillations and a-p distraction to R knee with decreased pain per pt.  Will focus on ambulation this pm.  She would con't to benefit from skilled PT to improve RLE strength and functional mobility tolerance.    Follow Up Recommendations  SNF     Equipment Recommendations       Recommendations for Other Services       Precautions / Restrictions Precautions Precautions: Knee;Fall Precaution Booklet Issued: No Restrictions Weight Bearing Restrictions: Yes RLE Weight Bearing: Weight bearing as tolerated    Mobility  Bed Mobility               General bed mobility comments: Pt presented on BSC   Transfers Overall transfer level: Needs assistance Equipment used: Rolling walker (2 wheeled) Transfers: Sit to/from Stand Sit to Stand: Min assist         General transfer comment: BSC to chair, forward flexed posture, cues for PHP  Ambulation/Gait Ambulation/Gait assistance: Min assist Ambulation Distance (Feet): 4 Feet Assistive device: Rolling walker (2 wheeled) Gait Pattern/deviations: Step-to pattern     General Gait Details: few steps to recliner chair shuffling steps    Stairs            Wheelchair Mobility    Modified Rankin (Stroke Patients Only)       Balance Overall balance assessment: Needs assistance Sitting-balance support: Feet supported Sitting balance-Leahy Scale: Normal     Standing balance support: Bilateral upper extremity  supported Standing balance-Leahy Scale: Fair                      Cognition Arousal/Alertness: Awake/alert Behavior During Therapy: WFL for tasks assessed/performed Overall Cognitive Status: Within Functional Limits for tasks assessed                      Exercises Other Exercises Other Exercises: Pt performed seated AA LAQ, hip abd, ankle pumps, SAQ, QS, GS x 12 b/l.  PTA did gentle oscilations grade I to R knee for pain management     General Comments        Pertinent Vitals/Pain Pain Assessment: 0-10 Pain Score: 5  Pain Location: R knee Pain Descriptors / Indicators: Operative site guarding Pain Intervention(s): Limited activity within patient's tolerance    Home Living                      Prior Function            PT Goals (current goals can now be found in the care plan section) Acute Rehab PT Goals Patient Stated Goal: to get stronger PT Goal Formulation: With patient Time For Goal Achievement: 03/14/15 Potential to Achieve Goals: Good Progress towards PT goals: Progressing toward goals    Frequency  BID    PT Plan Current plan remains appropriate    Co-evaluation  End of Session Equipment Utilized During Treatment: Gait belt Activity Tolerance: Patient tolerated treatment well;Patient limited by pain Patient left: in chair;with call bell/phone within reach;with nursing/sitter in room     Time: 1045-     Charges:  $Therapeutic Exercise: 8-22 mins $Therapeutic Activity: 8-22 mins                    G Codes:      Denise Washburn 2015-03-14, 12:08 PM  Navid Lenzen, PTA

## 2015-03-22 DIAGNOSIS — F419 Anxiety disorder, unspecified: Secondary | ICD-10-CM | POA: Diagnosis not present

## 2015-03-22 DIAGNOSIS — Z471 Aftercare following joint replacement surgery: Secondary | ICD-10-CM | POA: Diagnosis not present

## 2015-03-22 DIAGNOSIS — Z96651 Presence of right artificial knee joint: Secondary | ICD-10-CM | POA: Diagnosis not present

## 2015-03-22 DIAGNOSIS — F329 Major depressive disorder, single episode, unspecified: Secondary | ICD-10-CM | POA: Diagnosis not present

## 2015-03-22 DIAGNOSIS — Z9181 History of falling: Secondary | ICD-10-CM | POA: Diagnosis not present

## 2015-03-22 DIAGNOSIS — J45909 Unspecified asthma, uncomplicated: Secondary | ICD-10-CM | POA: Diagnosis not present

## 2015-03-22 DIAGNOSIS — I10 Essential (primary) hypertension: Secondary | ICD-10-CM | POA: Diagnosis not present

## 2015-03-24 DIAGNOSIS — F419 Anxiety disorder, unspecified: Secondary | ICD-10-CM | POA: Diagnosis not present

## 2015-03-24 DIAGNOSIS — J45909 Unspecified asthma, uncomplicated: Secondary | ICD-10-CM | POA: Diagnosis not present

## 2015-03-24 DIAGNOSIS — Z9181 History of falling: Secondary | ICD-10-CM | POA: Diagnosis not present

## 2015-03-24 DIAGNOSIS — I10 Essential (primary) hypertension: Secondary | ICD-10-CM | POA: Diagnosis not present

## 2015-03-24 DIAGNOSIS — Z96651 Presence of right artificial knee joint: Secondary | ICD-10-CM | POA: Diagnosis not present

## 2015-03-24 DIAGNOSIS — Z471 Aftercare following joint replacement surgery: Secondary | ICD-10-CM | POA: Diagnosis not present

## 2015-03-24 DIAGNOSIS — F329 Major depressive disorder, single episode, unspecified: Secondary | ICD-10-CM | POA: Diagnosis not present

## 2015-03-28 DIAGNOSIS — Z96651 Presence of right artificial knee joint: Secondary | ICD-10-CM | POA: Diagnosis not present

## 2015-03-28 DIAGNOSIS — Z9181 History of falling: Secondary | ICD-10-CM | POA: Diagnosis not present

## 2015-03-28 DIAGNOSIS — F419 Anxiety disorder, unspecified: Secondary | ICD-10-CM | POA: Diagnosis not present

## 2015-03-28 DIAGNOSIS — Z471 Aftercare following joint replacement surgery: Secondary | ICD-10-CM | POA: Diagnosis not present

## 2015-03-28 DIAGNOSIS — J45909 Unspecified asthma, uncomplicated: Secondary | ICD-10-CM | POA: Diagnosis not present

## 2015-03-28 DIAGNOSIS — F329 Major depressive disorder, single episode, unspecified: Secondary | ICD-10-CM | POA: Diagnosis not present

## 2015-03-28 DIAGNOSIS — I10 Essential (primary) hypertension: Secondary | ICD-10-CM | POA: Diagnosis not present

## 2015-03-30 DIAGNOSIS — F329 Major depressive disorder, single episode, unspecified: Secondary | ICD-10-CM | POA: Diagnosis not present

## 2015-03-30 DIAGNOSIS — J45909 Unspecified asthma, uncomplicated: Secondary | ICD-10-CM | POA: Diagnosis not present

## 2015-03-30 DIAGNOSIS — I10 Essential (primary) hypertension: Secondary | ICD-10-CM | POA: Diagnosis not present

## 2015-03-30 DIAGNOSIS — Z471 Aftercare following joint replacement surgery: Secondary | ICD-10-CM | POA: Diagnosis not present

## 2015-03-30 DIAGNOSIS — Z96651 Presence of right artificial knee joint: Secondary | ICD-10-CM | POA: Diagnosis not present

## 2015-03-30 DIAGNOSIS — F419 Anxiety disorder, unspecified: Secondary | ICD-10-CM | POA: Diagnosis not present

## 2015-03-30 DIAGNOSIS — Z9181 History of falling: Secondary | ICD-10-CM | POA: Diagnosis not present

## 2015-04-05 DIAGNOSIS — F329 Major depressive disorder, single episode, unspecified: Secondary | ICD-10-CM | POA: Diagnosis not present

## 2015-04-05 DIAGNOSIS — J45909 Unspecified asthma, uncomplicated: Secondary | ICD-10-CM | POA: Diagnosis not present

## 2015-04-05 DIAGNOSIS — Z471 Aftercare following joint replacement surgery: Secondary | ICD-10-CM | POA: Diagnosis not present

## 2015-04-05 DIAGNOSIS — Z9181 History of falling: Secondary | ICD-10-CM | POA: Diagnosis not present

## 2015-04-05 DIAGNOSIS — I10 Essential (primary) hypertension: Secondary | ICD-10-CM | POA: Diagnosis not present

## 2015-04-05 DIAGNOSIS — Z96651 Presence of right artificial knee joint: Secondary | ICD-10-CM | POA: Diagnosis not present

## 2015-04-05 DIAGNOSIS — F419 Anxiety disorder, unspecified: Secondary | ICD-10-CM | POA: Diagnosis not present

## 2015-04-10 DIAGNOSIS — Z96651 Presence of right artificial knee joint: Secondary | ICD-10-CM | POA: Diagnosis not present

## 2015-04-11 DIAGNOSIS — Z9181 History of falling: Secondary | ICD-10-CM | POA: Diagnosis not present

## 2015-04-11 DIAGNOSIS — I10 Essential (primary) hypertension: Secondary | ICD-10-CM | POA: Diagnosis not present

## 2015-04-11 DIAGNOSIS — Z96651 Presence of right artificial knee joint: Secondary | ICD-10-CM | POA: Diagnosis not present

## 2015-04-11 DIAGNOSIS — J45909 Unspecified asthma, uncomplicated: Secondary | ICD-10-CM | POA: Diagnosis not present

## 2015-04-11 DIAGNOSIS — Z471 Aftercare following joint replacement surgery: Secondary | ICD-10-CM | POA: Diagnosis not present

## 2015-04-11 DIAGNOSIS — F329 Major depressive disorder, single episode, unspecified: Secondary | ICD-10-CM | POA: Diagnosis not present

## 2015-04-11 DIAGNOSIS — F419 Anxiety disorder, unspecified: Secondary | ICD-10-CM | POA: Diagnosis not present

## 2015-04-13 DIAGNOSIS — J45909 Unspecified asthma, uncomplicated: Secondary | ICD-10-CM | POA: Diagnosis not present

## 2015-04-13 DIAGNOSIS — F329 Major depressive disorder, single episode, unspecified: Secondary | ICD-10-CM | POA: Diagnosis not present

## 2015-04-13 DIAGNOSIS — F419 Anxiety disorder, unspecified: Secondary | ICD-10-CM | POA: Diagnosis not present

## 2015-04-13 DIAGNOSIS — I10 Essential (primary) hypertension: Secondary | ICD-10-CM | POA: Diagnosis not present

## 2015-04-13 DIAGNOSIS — Z96651 Presence of right artificial knee joint: Secondary | ICD-10-CM | POA: Diagnosis not present

## 2015-04-13 DIAGNOSIS — Z471 Aftercare following joint replacement surgery: Secondary | ICD-10-CM | POA: Diagnosis not present

## 2015-04-13 DIAGNOSIS — Z9181 History of falling: Secondary | ICD-10-CM | POA: Diagnosis not present

## 2015-04-18 DIAGNOSIS — Z471 Aftercare following joint replacement surgery: Secondary | ICD-10-CM | POA: Diagnosis not present

## 2015-04-18 DIAGNOSIS — J45909 Unspecified asthma, uncomplicated: Secondary | ICD-10-CM | POA: Diagnosis not present

## 2015-04-18 DIAGNOSIS — F419 Anxiety disorder, unspecified: Secondary | ICD-10-CM | POA: Diagnosis not present

## 2015-04-18 DIAGNOSIS — I10 Essential (primary) hypertension: Secondary | ICD-10-CM | POA: Diagnosis not present

## 2015-04-18 DIAGNOSIS — F329 Major depressive disorder, single episode, unspecified: Secondary | ICD-10-CM | POA: Diagnosis not present

## 2015-04-18 DIAGNOSIS — Z9181 History of falling: Secondary | ICD-10-CM | POA: Diagnosis not present

## 2015-04-18 DIAGNOSIS — Z96651 Presence of right artificial knee joint: Secondary | ICD-10-CM | POA: Diagnosis not present

## 2015-04-20 DIAGNOSIS — J45909 Unspecified asthma, uncomplicated: Secondary | ICD-10-CM | POA: Diagnosis not present

## 2015-04-20 DIAGNOSIS — F419 Anxiety disorder, unspecified: Secondary | ICD-10-CM | POA: Diagnosis not present

## 2015-04-20 DIAGNOSIS — F329 Major depressive disorder, single episode, unspecified: Secondary | ICD-10-CM | POA: Diagnosis not present

## 2015-04-20 DIAGNOSIS — Z9181 History of falling: Secondary | ICD-10-CM | POA: Diagnosis not present

## 2015-04-20 DIAGNOSIS — Z471 Aftercare following joint replacement surgery: Secondary | ICD-10-CM | POA: Diagnosis not present

## 2015-04-20 DIAGNOSIS — I10 Essential (primary) hypertension: Secondary | ICD-10-CM | POA: Diagnosis not present

## 2015-04-20 DIAGNOSIS — Z96651 Presence of right artificial knee joint: Secondary | ICD-10-CM | POA: Diagnosis not present

## 2015-04-29 ENCOUNTER — Emergency Department
Admission: EM | Admit: 2015-04-29 | Discharge: 2015-04-29 | Disposition: A | Discharge status: Home / Self Care | Payer: Commercial Managed Care - HMO | Attending: Emergency Medicine | Admitting: Emergency Medicine

## 2015-04-29 ENCOUNTER — Emergency Department: Payer: Commercial Managed Care - HMO

## 2015-04-29 ENCOUNTER — Encounter: Payer: Self-pay | Admitting: Emergency Medicine

## 2015-04-29 DIAGNOSIS — J301 Allergic rhinitis due to pollen: Secondary | ICD-10-CM | POA: Diagnosis not present

## 2015-04-29 DIAGNOSIS — I10 Essential (primary) hypertension: Secondary | ICD-10-CM | POA: Diagnosis not present

## 2015-04-29 DIAGNOSIS — Z7901 Long term (current) use of anticoagulants: Secondary | ICD-10-CM | POA: Diagnosis not present

## 2015-04-29 DIAGNOSIS — Z96652 Presence of left artificial knee joint: Secondary | ICD-10-CM | POA: Insufficient documentation

## 2015-04-29 DIAGNOSIS — J9801 Acute bronchospasm: Secondary | ICD-10-CM | POA: Insufficient documentation

## 2015-04-29 DIAGNOSIS — Z7951 Long term (current) use of inhaled steroids: Secondary | ICD-10-CM | POA: Diagnosis not present

## 2015-04-29 DIAGNOSIS — Z79899 Other long term (current) drug therapy: Secondary | ICD-10-CM | POA: Insufficient documentation

## 2015-04-29 DIAGNOSIS — R05 Cough: Secondary | ICD-10-CM | POA: Diagnosis present

## 2015-04-29 DIAGNOSIS — R0602 Shortness of breath: Secondary | ICD-10-CM | POA: Diagnosis not present

## 2015-04-29 MED ORDER — LORATADINE 10 MG PO TABS: 10.0000 mg | ORAL_TABLET | Freq: Every day | ORAL | Status: DC

## 2015-04-29 MED ORDER — PREDNISONE 10 MG PO TABS: ORAL_TABLET | ORAL | Status: DC

## 2015-04-29 MED ORDER — ALBUTEROL SULFATE (2.5 MG/3ML) 0.083% IN NEBU
5.0000 mg | INHALATION_SOLUTION | Freq: Once | RESPIRATORY_TRACT | Status: AC
  Administered 2015-04-29: 5 mg via RESPIRATORY_TRACT
  Filled 2015-04-29: qty 6

## 2015-04-29 NOTE — ED Notes (Signed)
Pt alert and oriented X4, active, cooperative, pt in NAD. RR even and unlabored, color WNL.  Pt informed to return if any life threatening symptoms occur.   

## 2015-04-29 NOTE — ED Notes (Signed)
Clear nasal congestion, nonproductive cough. Pt denies pain. Hx of seasonal allergies. Pt alert and oriented X4, active, cooperative, pt in NAD. RR even and unlabored, color WNL.

## 2015-04-29 NOTE — ED Provider Notes (Signed)
[facility] Emergency Department Provider Note  ____________________________________________  Time seen: Approximately 8:36 AM  I have reviewed the triage vital signs and the nursing notes.   HISTORY  Chief Complaint Nasal Congestion and Cough    HPI Michelle Dalton is a 55 y.o. female , NAD, presents to the emergency department with 3 day history of cough and chest congestion. States she has had increased nasal congestion, runny nose, sneezing over the last couple weeks due to increased pollen in the area. Has a history of asthma and has been using her albuterol rescue inhaler every 2-3 hours without resolution of the chest congestion. Has had some shortness of breath and chest tightness due to her asthma. Has been utilizing Qvar twice daily but doesn't feel that's been helping. Has also noted she's been on Flovent without alleviation of her asthma symptoms.That she has had similar symptoms in the past in which improved on prednisone. Has taken over-the-counter cold medications without alleviation of nasal symptoms nor cough. Denies any chest pain, back pain, headache, visual changes, numbness, weakness, tingling. Denies any abdominal pain, nausea, vomiting.   Past Medical History  Diagnosis Date  . Hypertension   . Asthma   . Seasonal allergies   . Arthritis     Patient Active Problem List   Diagnosis Date Noted  . 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 Abdominal hysterectomy    . Ankle fracture surgery Left   . Joint replacement Left     knee  . Tubal ligation    . Total knee arthroplasty Right 02/28/2015    Procedure: TOTAL KNEE ARTHROPLASTY;  Surgeon: Corky Mull, MD;  Location: [facility] ORS;  Service: Orthopedics;  Laterality: Right;    Current Outpatient Rx  Name  Route  Sig  Dispense  Refill  . albuterol (VENTOLIN HFA) 108 (90 Base) MCG/ACT inhaler   Inhalation   Inhale  1-2 puffs into the lungs every 4 (four) hours as needed for wheezing or shortness of breath.         Marland Kitchen amitriptyline (ELAVIL) 25 MG tablet   Oral   Take 25 mg by mouth at bedtime.         Marland Kitchen amLODipine (NORVASC) 10 MG tablet   Oral   Take 10 mg by mouth daily.         . beclomethasone (QVAR) 80 MCG/ACT inhaler   Inhalation   Inhale 2 puffs into the lungs 2 (two) times daily. Reported on 02/28/2015         . enoxaparin (LOVENOX) 40 MG/0.4ML injection   Subcutaneous   Inject 0.4 mLs (40 mg total) into the skin daily.   14 Syringe   0   . Fluticasone Propionate, Inhal, (FLOVENT DISKUS) 100 MCG/BLIST AEPB   Inhalation   Inhale 1 puff into the lungs 2 (two) times daily.         . hydrALAZINE (APRESOLINE) 10 MG tablet   Oral   Take 10 mg by mouth 3 (three) times daily.         . hydrochlorothiazide (HYDRODIURIL) 25 MG tablet   Oral   Take 25 mg by mouth daily.         Marland Kitchen labetalol (NORMODYNE) 100 MG tablet   Oral   Take 100 mg by mouth 2 (two) times daily.         Marland Kitchen loratadine (CLARITIN) 10 MG tablet   Oral  Take 1 tablet (10 mg total) by mouth daily.   30 tablet   0   . montelukast (SINGULAIR) 10 MG tablet   Oral   Take 10 mg by mouth at bedtime.         Marland Kitchen oxyCODONE (OXY IR/ROXICODONE) 5 MG immediate release tablet   Oral   Take 1-2 tablets (5-10 mg total) by mouth every 4 (four) hours as needed for breakthrough pain.   60 tablet   0   . potassium chloride SA (K-DUR,KLOR-CON) 20 MEQ tablet   Oral   Take 1 tablet (20 mEq total) by mouth daily. For 10 days.   10 tablet   0   . predniSONE (DELTASONE) 10 MG tablet      Take a daily regimen of 6,5,4,3,2,1   21 tablet   0   . traMADol (ULTRAM) 50 MG tablet   Oral   Take by mouth every 8 (eight) hours as needed.         . traMADol (ULTRAM) 50 MG tablet   Oral   Take 1-2 tablets (50-100 mg total) by mouth every 6 (six) hours as needed for severe pain.   40 tablet   0      Allergies Lisinopril  No family history on file.  Social History Social History  Substance Use Topics  . Smoking status: Never Smoker   . Smokeless tobacco: Never Used  . Alcohol Use: No     Review of Systems  Constitutional: No fever/chills, fatigue Eyes: No visual changes. No discharge, redness ENT: Positive nasal congestion, runny nose, sneezing. No ear pain, sore throat, sinus pressure. Cardiovascular: No chest pain but notes chest tightness. Respiratory: Positive shortness breath, wheezing, cough, chest congestion.  Gastrointestinal: No abdominal pain.  No nausea, vomiting.   Musculoskeletal: Negative for back, neck pain.  Skin: Negative for rash, skin sores. Neurological: Negative for headaches, focal weakness or numbness. No tingling 10-point ROS otherwise negative.  ____________________________________________   PHYSICAL EXAM:  VITAL SIGNS: ED Triage Vitals  Enc Vitals Group     BP 04/29/15 0659 150/79 mmHg     Pulse Rate 04/29/15 0659 90     Resp 04/29/15 0659 20     Temp 04/29/15 0659 98.2 F (36.8 C)     Temp Source 04/29/15 0659 Oral     SpO2 04/29/15 0659 96 %     Weight 04/29/15 0659 230 lb (104.327 kg)     Height 04/29/15 0659 5\' 7"  (1.702 m)     Head Cir --      Peak Flow --      Pain Score 04/29/15 0704 0     Pain Loc --      Pain Edu? --      Excl. in [place]? --      Constitutional: Alert and oriented. Well appearing and in no acute distress. Eyes: Conjunctivae are normal. PERRL. EOMI without pain.  Head: Atraumatic. ENT:      Ears: TMs visualized bilaterally without erythema, effusion, bulging, perforation.      Nose: Mild congestion with moderate clear rhinnorhea.      Mouth/Throat: Mucous membranes are moist. Pharynx without erythema, swelling, exudate. Uvula midline. Moderate postnasal drip noted. Neck: No stridor. Supple with full range of motion. Hematological/Lymphatic/Immunilogical: No cervical lymphadenopathy. Cardiovascular:  Normal rate, regular rhythm. Normal S1 and S2.  Good peripheral circulation. Respiratory: Normal respiratory effort without tachypnea or retractions. Mild wheeze noted in bilateral lower lung fields without evidence of rhonchi or rales.  Lungs CTAB in upper and middle lung fields. Breath sounds were noted throughout all lung fields. Neurologic:  Normal speech and language. No gross focal neurologic deficits are appreciated.  Skin:  Skin is warm, dry and intact. No rash noted. Psychiatric: Mood and affect are normal. Speech and behavior are normal. Patient exhibits appropriate insight and judgement.   ____________________________________________   LABS  None  ____________________________________________  EKG  EKG reveals normal sinus rhythm with a ventricular rate of 86 bpm. No evidence of acute changes nor STEMI. EKG was also reviewed by Dr. Lenise Arena. ____________________________________________  [id number] I have personally viewed and evaluated these images (plain radiographs) as part of my medical decision making, as well as reviewing the written report by the radiologist.  Dg Chest 2 View  04/29/2015  CLINICAL DATA:  SOB X 3 days. Hx of asthma. Nonsmoker. EXAM: CHEST  2 VIEW COMPARISON:  03/02/2015 FINDINGS: The heart size and mediastinal contours are within normal limits. Both lungs are clear. Small calcified granulomata are identified throughout the lungs. The visualized skeletal structures are unremarkable. IMPRESSION: No evidence for acute cardiopulmonary abnormality. Electronically Signed   By: Nolon Nations M.D.   On: 04/29/2015 07:35    ____________________________________________    PROCEDURES  Procedure(s) performed: None      Medications  albuterol (PROVENTIL) (2.5 MG/3ML) 0.083% nebulizer solution 5 mg (5 mg Nebulization Given 04/29/15 0709)   Patient notes improvement in breathing after albuterol nebulized solution was  administered.  ____________________________________________   INITIAL IMPRESSION / ASSESSMENT AND PLAN / ED COURSE  Pertinent imaging results that were available during my care of the patient were reviewed by me and considered in my medical decision making (see chart for details).  Patient's diagnosis is consistent with acute bronchospasm due to allergic rhinitis. Patient will be discharged home with prescriptions for prednisone and loratadine to take as directed. Patient is to follow up with her primary care provider if symptoms persist past this treatment course. Patient is given ED precautions to return to the ED for any worsening or new symptoms.      ____________________________________________  FINAL CLINICAL IMPRESSION(S) / ED DIAGNOSES  Final diagnoses:  Bronchospasm, acute  Allergic rhinitis due to pollen      NEW MEDICATIONS STARTED DURING THIS VISIT:  New Prescriptions   LORATADINE (CLARITIN) 10 MG TABLET    Take 1 tablet (10 mg total) by mouth daily.   PREDNISONE (DELTASONE) 10 MG TABLET    Take a daily regimen of 6,5,4,3,2,1    EKG: Interpreted by me, normal sinus rhythm with a rate of 86 bpm, normal PR interval, normal QRS, normal QT interval. LVH.     Braxton Feathers, PA-C 04/29/15 [id number]  Earleen Newport, MD 04/29/15 [phone]

## 2015-04-29 NOTE — ED Notes (Signed)
Patient presents to the ED with cough and congestion x 3 days.  Patient reports history of asthma.  Patient reports having some difficulty breathing.  States, "I think it's the pollen."  Patient is alert and oriented.  Ambulatory to triage without distress.  Respirations are even and nonlabored at this time.

## 2015-04-29 NOTE — Discharge Instructions (Signed)
Allergic Rhinitis Allergic rhinitis is when the mucous membranes in the nose respond to allergens. Allergens are particles in the air that cause your body to have an allergic reaction. This causes you to release allergic antibodies. Through a chain of events, these eventually cause you to release histamine into the blood stream. Although meant to protect the body, it is this release of histamine that causes your discomfort, such as frequent sneezing, congestion, and an itchy, runny nose.  CAUSES Seasonal allergic rhinitis (hay fever) is caused by pollen allergens that may come from grasses, trees, and weeds. Year-round allergic rhinitis (perennial allergic rhinitis) is caused by allergens such as house dust mites, pet dander, and mold spores. SYMPTOMS  Nasal stuffiness (congestion).  Itchy, runny nose with sneezing and tearing of the eyes. DIAGNOSIS Your health care provider can help you determine the allergen or allergens that trigger your symptoms. If you and your health care provider are unable to determine the allergen, skin or blood testing may be used. Your health care provider will diagnose your condition after taking your health history and performing a physical exam. Your health care provider may assess you for other related conditions, such as asthma, pink eye, or an ear infection. TREATMENT Allergic rhinitis does not have a cure, but it can be controlled by:  Medicines that block allergy symptoms. These may include allergy shots, nasal sprays, and oral antihistamines.  Avoiding the allergen. Hay fever may often be treated with antihistamines in pill or nasal spray forms. Antihistamines block the effects of histamine. There are over-the-counter medicines that may help with nasal congestion and swelling around the eyes. Check with your health care provider before taking or giving this medicine. If avoiding the allergen or the medicine prescribed do not work, there are many new medicines  your health care provider can prescribe. Stronger medicine may be used if initial measures are ineffective. Desensitizing injections can be used if medicine and avoidance does not work. Desensitization is when a patient is given ongoing shots until the body becomes less sensitive to the allergen. Make sure you follow up with your health care provider if problems continue. HOME CARE INSTRUCTIONS It is not possible to completely avoid allergens, but you can reduce your symptoms by taking steps to limit your exposure to them. It helps to know exactly what you are allergic to so that you can avoid your specific triggers. SEEK MEDICAL CARE IF:  You have a fever.  You develop a cough that does not stop easily (persistent).  You have shortness of breath.  You start wheezing.  Symptoms interfere with normal daily activities.   This information is not intended to replace advice given to you by your health care provider. Make sure you discuss any questions you have with your health care provider.   Document Released: 09/18/2000 Document Revised: 01/14/2014 Document Reviewed: 08/31/2012 Elsevier Interactive Patient Education 2016 [employer]  Bronchospasm, Adult A bronchospasm is when the tubes that carry air in and out of your lungs (airways) spasm or tighten. During a bronchospasm it is hard to breathe. This is because the airways get smaller. A bronchospasm can be triggered by:  Allergies. These may be to animals, pollen, food, or mold.  Infection. This is a common cause of bronchospasm.  Exercise.  Irritants. These include pollution, cigarette smoke, strong odors, aerosol sprays, and paint fumes.  Weather changes.  Stress.  Being emotional. HOME CARE   Always have a plan for getting help. Know when to call your  doctor and local emergency services (911 in the [place] Know where you can get emergency care.  Only take medicines as told by your doctor.  If you were prescribed an  inhaler or nebulizer machine, ask your doctor how to use it correctly. Always use a spacer with your inhaler if you were given one.  Stay calm during an attack. Try to relax and breathe more slowly.  Control your home environment:  Change your heating and air conditioning filter at least once a month.  Limit your use of fireplaces and wood stoves.  Do not  smoke. Do not  allow smoking in your home.  Avoid perfumes and fragrances.  Get rid of pests (such as roaches and mice) and their droppings.  Throw away plants if you see mold on them.  Keep your house clean and dust free.  Replace carpet with wood, tile, or vinyl flooring. Carpet can trap dander and dust.  Use allergy-proof pillows, mattress covers, and box spring covers.  Wash bed sheets and blankets every week in hot water. Dry them in a dryer.  Use blankets that are made of polyester or cotton.  Wash hands frequently. GET HELP IF:  You have muscle aches.  You have chest pain.  The thick spit you spit or cough up (sputum) changes from clear or white to yellow, green, gray, or bloody.  The thick spit you spit or cough up gets thicker.  There are problems that may be related to the medicine you are given such as:  A rash.  Itching.  Swelling.  Trouble breathing. GET HELP RIGHT AWAY IF:  You feel you cannot breathe or catch your breath.  You cannot stop coughing.  Your treatment is not helping you breathe better.  You have very bad chest pain. MAKE SURE YOU:   Understand these instructions.  Will watch your condition.  Will get help right away if you are not doing well or get worse.   This information is not intended to replace advice given to you by your health care provider. Make sure you discuss any questions you have with your health care provider.   Document Released: 10/21/2008 Document Revised: 01/14/2014 Document Reviewed: 06/16/2012 Elsevier Interactive Patient Education 2016 [place] Prevention While you may not be able to control the fact that you have asthma, you can take actions to prevent asthma attacks. The best way to prevent asthma attacks is to maintain good control of your asthma. You can achieve this by:  Taking your medicines as directed.  Avoiding things that can irritate your airways or make your asthma symptoms worse (asthma triggers).  Keeping track of how well your asthma is controlled and of any changes in your symptoms.  Responding quickly to worsening asthma symptoms (asthma attack).  Seeking emergency care when it is needed. WHAT ARE SOME WAYS TO PREVENT AN ASTHMA ATTACK? Have a Plan Work with your health care provider to create a written plan for managing and treating your asthma attacks (asthma action plan). This plan includes:  A list of your asthma triggers and how you can avoid them.  Information on when medicines should be taken and when their dosages should be changed.  The use of a device that measures how well your lungs are working (peak flow meter). Monitor Your Asthma Use your peak flow meter and record your results in a journal every day. A drop in your peak flow numbers on one or more days may indicate the  start of an asthma attack. This can happen even before you start to feel symptoms. You can prevent an asthma attack from getting worse by following the steps in your asthma action plan. Avoid Asthma Triggers Work with your asthma health care provider to find out what your asthma triggers are. This can be done by:  Allergy testing.  Keeping a journal that notes when asthma attacks occur and the factors that may have contributed to them.  Determining if there are other medical conditions that are making your asthma worse. Once you have determined your asthma triggers, take steps to avoid them. This may include avoiding excessive or prolonged exposure to:  Dust. Have someone dust and vacuum your home for you  once or twice a week. Using a high-efficiency particulate arrestance (HEPA) vacuum is best.  Smoke. This includes campfire smoke, forest fire smoke, and secondhand smoke from tobacco products.  Pet dander. Avoid contact with animals that you know you are allergic to.  Allergens from trees, grasses or pollens. Avoid spending a lot of time outdoors when pollen counts are high, and on very windy days.  Very cold, dry, or humid air.  Mold.  Foods that contain high amounts of sulfites.  Strong odors.  Outdoor air pollutants, such as Lexic[employer].  Indoor air pollutants, such as aerosol sprays and fumes from household cleaners.  Household pests, including dust mites and cockroaches, and pest droppings.  Certain medicines, including NSAIDs. Always talk to your health care provider before stopping or starting any new medicines. Medicines Take over-the-counter and prescription medicines only as told by your health care provider. Many asthma attacks can be prevented by carefully following your medicine schedule. Taking your medicines correctly is especially important when you cannot avoid certain asthma triggers. Act Quickly If an asthma attack does happen, acting quickly can decrease how severe it is and how long it lasts. Take these steps:   Pay attention to your symptoms. If you are coughing, wheezing, or having difficulty breathing, do not wait to see if your symptoms go away on their own. Follow your asthma action plan.  If you have followed your asthma action plan and your symptoms are not improving, call your health care provider or seek immediate medical care at the nearest hospital. It is important to note how often you need to use your fast-acting rescue inhaler. If you are using your rescue inhaler more often, it may mean that your asthma is not under control. Adjusting your asthma treatment plan may help you to prevent future asthma attacks and help you to gain better control of  your condition. HOW CAN I PREVENT AN ASTHMA ATTACK WHEN I EXERCISE? Follow advice from your health care provider about whether you should use your fast-acting inhaler before exercising. Many people with asthma experience exercise-induced bronchoconstriction (EIB). This condition often worsens during vigorous exercise in cold, humid, or dry environments. Usually, people with EIB can stay very active by pre-treating with a fast-acting inhaler before exercising.   This information is not intended to replace advice given to you by your health care provider. Make sure you discuss any questions you have with your health care provider.   Document Released: 12/12/2008 Document Revised: 09/14/2014 Document Reviewed: 05/26/2014 Elsevier Interactive Patient Education [employer].

## 2015-05-03 DIAGNOSIS — J45901 Unspecified asthma with (acute) exacerbation: Secondary | ICD-10-CM | POA: Diagnosis not present

## 2015-05-23 DIAGNOSIS — M1611 Unilateral primary osteoarthritis, right hip: Secondary | ICD-10-CM | POA: Diagnosis not present

## 2015-06-09 DIAGNOSIS — Z96651 Presence of right artificial knee joint: Secondary | ICD-10-CM | POA: Diagnosis not present

## 2015-06-16 ENCOUNTER — Other Ambulatory Visit
Admission: RE | Admit: 2015-06-16 | Discharge: 2015-06-16 | Disposition: A | Discharge status: Home / Self Care | Payer: Commercial Managed Care - HMO | Source: Ambulatory Visit | Attending: Surgery | Admitting: Surgery

## 2015-06-16 DIAGNOSIS — M25461 Effusion, right knee: Secondary | ICD-10-CM | POA: Diagnosis not present

## 2015-06-16 DIAGNOSIS — Z96651 Presence of right artificial knee joint: Secondary | ICD-10-CM | POA: Insufficient documentation

## 2015-06-16 LAB — SYNOVIAL CELL COUNT + DIFF, W/ CRYSTALS
Crystals, Fluid: NONE SEEN
Eosinophils-Synovial: 0 %
Lymphocytes-Synovial Fld: 79 %
Monocyte-Macrophage-Synovial Fluid: 10 %
NEUTROPHIL, SYNOVIAL: 11 %
OTHER CELLS-SYN: 0
WBC, SYNOVIAL: 742 /mm3 — AB (ref 0–200)

## 2015-06-20 LAB — BODY FLUID CULTURE: CULTURE: NO GROWTH

## 2015-06-30 ENCOUNTER — Other Ambulatory Visit: Payer: Self-pay | Admitting: Family Medicine

## 2015-06-30 DIAGNOSIS — K5900 Constipation, unspecified: Secondary | ICD-10-CM | POA: Diagnosis not present

## 2015-06-30 DIAGNOSIS — J4521 Mild intermittent asthma with (acute) exacerbation: Secondary | ICD-10-CM | POA: Diagnosis not present

## 2015-06-30 DIAGNOSIS — J45909 Unspecified asthma, uncomplicated: Secondary | ICD-10-CM | POA: Diagnosis not present

## 2015-07-20 DIAGNOSIS — Z711 Person with feared health complaint in whom no diagnosis is made: Secondary | ICD-10-CM | POA: Diagnosis not present

## 2015-07-20 DIAGNOSIS — M545 Low back pain: Secondary | ICD-10-CM | POA: Diagnosis not present

## 2015-07-20 DIAGNOSIS — N390 Urinary tract infection, site not specified: Secondary | ICD-10-CM | POA: Diagnosis not present

## 2015-07-20 DIAGNOSIS — A599 Trichomoniasis, unspecified: Secondary | ICD-10-CM | POA: Diagnosis not present

## 2015-08-01 DIAGNOSIS — E6609 Other obesity due to excess calories: Secondary | ICD-10-CM | POA: Diagnosis not present

## 2015-08-01 DIAGNOSIS — J310 Chronic rhinitis: Secondary | ICD-10-CM | POA: Diagnosis not present

## 2015-08-01 DIAGNOSIS — K219 Gastro-esophageal reflux disease without esophagitis: Secondary | ICD-10-CM | POA: Diagnosis not present

## 2015-08-01 DIAGNOSIS — J4521 Mild intermittent asthma with (acute) exacerbation: Secondary | ICD-10-CM | POA: Diagnosis not present

## 2015-08-01 DIAGNOSIS — R0609 Other forms of dyspnea: Secondary | ICD-10-CM | POA: Diagnosis not present

## 2015-08-01 DIAGNOSIS — R05 Cough: Secondary | ICD-10-CM | POA: Diagnosis not present

## 2015-08-01 DIAGNOSIS — R0602 Shortness of breath: Secondary | ICD-10-CM | POA: Diagnosis not present

## 2015-08-03 DIAGNOSIS — J4521 Mild intermittent asthma with (acute) exacerbation: Secondary | ICD-10-CM | POA: Diagnosis not present

## 2015-08-03 DIAGNOSIS — E6609 Other obesity due to excess calories: Secondary | ICD-10-CM | POA: Diagnosis not present

## 2015-08-03 DIAGNOSIS — R05 Cough: Secondary | ICD-10-CM | POA: Diagnosis not present

## 2015-08-03 DIAGNOSIS — R0602 Shortness of breath: Secondary | ICD-10-CM | POA: Diagnosis not present

## 2015-08-03 DIAGNOSIS — R0609 Other forms of dyspnea: Secondary | ICD-10-CM | POA: Diagnosis not present

## 2015-08-03 DIAGNOSIS — J310 Chronic rhinitis: Secondary | ICD-10-CM | POA: Diagnosis not present

## 2015-08-03 DIAGNOSIS — K219 Gastro-esophageal reflux disease without esophagitis: Secondary | ICD-10-CM | POA: Diagnosis not present

## 2015-08-11 DIAGNOSIS — Z96651 Presence of right artificial knee joint: Secondary | ICD-10-CM | POA: Diagnosis not present

## 2015-08-28 DIAGNOSIS — R05 Cough: Secondary | ICD-10-CM | POA: Diagnosis not present

## 2015-08-28 DIAGNOSIS — J4520 Mild intermittent asthma, uncomplicated: Secondary | ICD-10-CM | POA: Diagnosis not present

## 2015-08-28 DIAGNOSIS — J310 Chronic rhinitis: Secondary | ICD-10-CM | POA: Diagnosis not present

## 2015-09-01 DIAGNOSIS — M1611 Unilateral primary osteoarthritis, right hip: Secondary | ICD-10-CM | POA: Diagnosis not present

## 2015-09-22 DIAGNOSIS — M1611 Unilateral primary osteoarthritis, right hip: Secondary | ICD-10-CM | POA: Diagnosis not present

## 2015-09-26 DIAGNOSIS — B370 Candidal stomatitis: Secondary | ICD-10-CM | POA: Diagnosis not present

## 2015-10-17 DIAGNOSIS — Z23 Encounter for immunization: Secondary | ICD-10-CM | POA: Diagnosis not present

## 2015-10-17 DIAGNOSIS — M25472 Effusion, left ankle: Secondary | ICD-10-CM | POA: Diagnosis not present

## 2015-10-17 DIAGNOSIS — M545 Low back pain: Secondary | ICD-10-CM | POA: Diagnosis not present

## 2015-11-13 DIAGNOSIS — H25013 Cortical age-related cataract, bilateral: Secondary | ICD-10-CM | POA: Diagnosis not present

## 2015-11-20 DIAGNOSIS — J4530 Mild persistent asthma, uncomplicated: Secondary | ICD-10-CM | POA: Diagnosis not present

## 2015-11-20 DIAGNOSIS — R05 Cough: Secondary | ICD-10-CM | POA: Diagnosis not present

## 2015-11-20 DIAGNOSIS — R682 Dry mouth, unspecified: Secondary | ICD-10-CM | POA: Diagnosis not present

## 2015-12-07 DIAGNOSIS — H6981 Other specified disorders of Eustachian tube, right ear: Secondary | ICD-10-CM | POA: Diagnosis not present

## 2015-12-28 DIAGNOSIS — H66001 Acute suppurative otitis media without spontaneous rupture of ear drum, right ear: Secondary | ICD-10-CM | POA: Diagnosis not present

## 2015-12-28 DIAGNOSIS — H6981 Other specified disorders of Eustachian tube, right ear: Secondary | ICD-10-CM | POA: Diagnosis not present

## 2016-01-18 DIAGNOSIS — H6981 Other specified disorders of Eustachian tube, right ear: Secondary | ICD-10-CM | POA: Diagnosis not present

## 2016-01-18 DIAGNOSIS — H908 Mixed conductive and sensorineural hearing loss, unspecified: Secondary | ICD-10-CM | POA: Diagnosis not present

## 2016-02-01 DIAGNOSIS — M170 Bilateral primary osteoarthritis of knee: Secondary | ICD-10-CM | POA: Diagnosis not present

## 2016-02-01 DIAGNOSIS — M1611 Unilateral primary osteoarthritis, right hip: Secondary | ICD-10-CM | POA: Diagnosis not present

## 2016-02-01 DIAGNOSIS — M059 Rheumatoid arthritis with rheumatoid factor, unspecified: Secondary | ICD-10-CM | POA: Diagnosis not present

## 2016-02-01 DIAGNOSIS — Z79899 Other long term (current) drug therapy: Secondary | ICD-10-CM | POA: Diagnosis not present

## 2016-02-12 DIAGNOSIS — M1611 Unilateral primary osteoarthritis, right hip: Secondary | ICD-10-CM | POA: Diagnosis not present

## 2016-02-12 DIAGNOSIS — M25551 Pain in right hip: Secondary | ICD-10-CM | POA: Diagnosis not present

## 2016-02-19 DIAGNOSIS — R0609 Other forms of dyspnea: Secondary | ICD-10-CM | POA: Diagnosis not present

## 2016-02-19 DIAGNOSIS — R079 Chest pain, unspecified: Secondary | ICD-10-CM | POA: Diagnosis not present

## 2016-02-19 DIAGNOSIS — J4530 Mild persistent asthma, uncomplicated: Secondary | ICD-10-CM | POA: Diagnosis not present

## 2016-02-19 DIAGNOSIS — R002 Palpitations: Secondary | ICD-10-CM | POA: Diagnosis not present

## 2016-02-22 DIAGNOSIS — M1611 Unilateral primary osteoarthritis, right hip: Secondary | ICD-10-CM | POA: Diagnosis not present

## 2016-02-22 DIAGNOSIS — R079 Chest pain, unspecified: Secondary | ICD-10-CM | POA: Diagnosis not present

## 2016-02-22 DIAGNOSIS — M170 Bilateral primary osteoarthritis of knee: Secondary | ICD-10-CM | POA: Diagnosis not present

## 2016-02-22 DIAGNOSIS — J4530 Mild persistent asthma, uncomplicated: Secondary | ICD-10-CM | POA: Diagnosis not present

## 2016-02-22 DIAGNOSIS — Z79899 Other long term (current) drug therapy: Secondary | ICD-10-CM | POA: Diagnosis not present

## 2016-02-22 DIAGNOSIS — M059 Rheumatoid arthritis with rheumatoid factor, unspecified: Secondary | ICD-10-CM | POA: Diagnosis not present

## 2016-02-23 DIAGNOSIS — R002 Palpitations: Secondary | ICD-10-CM | POA: Diagnosis not present

## 2016-02-26 DIAGNOSIS — Z0000 Encounter for general adult medical examination without abnormal findings: Secondary | ICD-10-CM | POA: Diagnosis not present

## 2016-02-27 DIAGNOSIS — R0602 Shortness of breath: Secondary | ICD-10-CM | POA: Diagnosis not present

## 2016-02-27 DIAGNOSIS — K219 Gastro-esophageal reflux disease without esophagitis: Secondary | ICD-10-CM | POA: Diagnosis not present

## 2016-02-27 DIAGNOSIS — M069 Rheumatoid arthritis, unspecified: Secondary | ICD-10-CM | POA: Diagnosis not present

## 2016-02-27 DIAGNOSIS — M1990 Unspecified osteoarthritis, unspecified site: Secondary | ICD-10-CM | POA: Diagnosis not present

## 2016-02-27 DIAGNOSIS — E669 Obesity, unspecified: Secondary | ICD-10-CM | POA: Diagnosis not present

## 2016-02-27 DIAGNOSIS — J45909 Unspecified asthma, uncomplicated: Secondary | ICD-10-CM | POA: Diagnosis not present

## 2016-02-27 DIAGNOSIS — I208 Other forms of angina pectoris: Secondary | ICD-10-CM | POA: Diagnosis not present

## 2016-02-27 DIAGNOSIS — I10 Essential (primary) hypertension: Secondary | ICD-10-CM | POA: Diagnosis not present

## 2016-03-05 DIAGNOSIS — I208 Other forms of angina pectoris: Secondary | ICD-10-CM | POA: Diagnosis not present

## 2016-03-05 DIAGNOSIS — R0602 Shortness of breath: Secondary | ICD-10-CM | POA: Diagnosis not present

## 2016-03-12 ENCOUNTER — Encounter
Admission: RE | Admit: 2016-03-12 | Discharge: 2016-03-12 | Disposition: A | Discharge status: Home / Self Care | Payer: Medicare HMO | Source: Ambulatory Visit | Attending: Surgery | Admitting: Surgery

## 2016-03-12 DIAGNOSIS — I10 Essential (primary) hypertension: Secondary | ICD-10-CM | POA: Diagnosis not present

## 2016-03-12 DIAGNOSIS — Z01818 Encounter for other preprocedural examination: Secondary | ICD-10-CM | POA: Insufficient documentation

## 2016-03-12 HISTORY — DX: Anxiety disorder, unspecified: F41.9

## 2016-03-12 HISTORY — DX: Depression, unspecified: F32.A

## 2016-03-12 HISTORY — DX: Major depressive disorder, single episode, unspecified: F32.9

## 2016-03-12 HISTORY — DX: Gastro-esophageal reflux disease without esophagitis: K21.9

## 2016-03-12 HISTORY — DX: Anemia, unspecified: D64.9

## 2016-03-12 LAB — BASIC METABOLIC PANEL
ANION GAP: 8 (ref 5–15)
BUN: 13 mg/dL (ref 6–20)
CALCIUM: 9.5 mg/dL (ref 8.9–10.3)
CO2: 31 mmol/L (ref 22–32)
Chloride: 99 mmol/L — ABNORMAL LOW (ref 101–111)
Creatinine, Ser: 0.74 mg/dL (ref 0.44–1.00)
GFR calc non Af Amer: >60 mL/min (ref >60)
Glucose, Bld: 98 mg/dL (ref 65–99)
POTASSIUM: 3 mmol/L — AB (ref 3.5–5.1)
Sodium: 138 mmol/L (ref 135–145)

## 2016-03-12 LAB — CBC
HCT: 38.3 % (ref 35.0–47.0)
Hemoglobin: 13 g/dL (ref 12.0–16.0)
MCH: 29.2 pg (ref 26.0–34.0)
MCHC: 34 g/dL (ref 32.0–36.0)
MCV: 85.7 fL (ref 80.0–100.0)
PLATELETS: 406 10*3/uL (ref 150–440)
RBC: 4.46 MIL/uL (ref 3.80–5.20)
RDW: 16 % — AB (ref 11.5–14.5)
WBC: 10.6 10*3/uL (ref 3.6–11.0)

## 2016-03-12 LAB — URINALYSIS, COMPLETE (UACMP) WITH MICROSCOPIC
Bilirubin Urine: NEGATIVE
Glucose, UA: NEGATIVE mg/dL
Hgb urine dipstick: NEGATIVE
KETONES UR: NEGATIVE mg/dL
Nitrite: NEGATIVE
PH: 5 (ref 5.0–8.0)
PROTEIN: NEGATIVE mg/dL
Specific Gravity, Urine: 1.012 (ref 1.005–1.030)

## 2016-03-12 LAB — SURGICAL PCR SCREEN
MRSA, PCR: NEGATIVE
STAPHYLOCOCCUS AUREUS: NEGATIVE

## 2016-03-12 LAB — TYPE AND SCREEN
ABO/RH(D): B POS
Antibody Screen: NEGATIVE

## 2016-03-12 LAB — PROTIME-INR
INR: 0.92
Prothrombin Time: 12.4 seconds (ref 11.4–15.2)

## 2016-03-12 NOTE — Patient Instructions (Addendum)
Your procedure is scheduled on: March 19, 2016 [facility]) Report to Same Day Surgery 2nd floor medical mall ([place] Entrance-take elevator on left to 2nd floor.  Check in with surgery information desk.) To find out your arrival time please call [phone] between 1PM - 3PM on  March 18, 2016 (MONDAY)  Remember: Instructions that are not followed completely may result in serious medical risk, up to and including death, or upon the discretion of your surgeon and anesthesiologist your surgery may need to be rescheduled.    _x___ 1. Do not eat food or drink liquids after midnight. No gum chewing or hard candies.                               __x__ 2. No Alcohol for 24 hours before or after surgery.   __x__3. No Smoking for 24 prior to surgery.   ____  4. Bring all medications with you on the day of surgery if instructed.    __x__ 5. Notify your doctor if there is any change in your medical condition     (cold, fever, infections).     Do not wear jewelry, make-up, hairpins, clips or nail polish.  Do not wear lotions, powders, or perfumes. You may wear deodorant.  Do not shave 48 hours prior to surgery. Men may shave face and neck.  Do not bring valuables to the hospital.    [facility] is not responsible for any belongings or valuables.               Contacts, dentures or bridgework may not be worn into surgery.  Leave your suitcase in the car. After surgery it may be brought to your room.  For patients admitted to the hospital, discharge time is determined by your                       treatment team.   Patients discharged the day of surgery will not be allowed to drive home.  You will need someone to drive you home and stay with you the night of your procedure.    Please read over the following fact sheets that you were given:   [facility] Preparing for Surgery and or MRSA Information   _x___ Take anti-hypertensive (unless it includes a diuretic), cardiac, seizure, asthma,                 anti-reflux and psychiatric medicines. These include:  1. AMLODIPINE  2. LABETALOL  3. HYDRALAZINE  4. SINGULAIR  5. PRILOSEC (PRILOSEC AT BEDTIME ON MONDAY NIGHT)  6.  ____Fleets enema or Magnesium Citrate as directed.   _x___ Use CHG Soap or sage wipes as directed on instruction sheet   _x___ Use inhalers on the day of surgery and bring to hospital day of surgery (USE ALBUTEROL NEBULIZER THE MORNING OF SURGERY AND BRING ALBUTEROL INHALER [place])  ____ Stop Metformin and Janumet 2 days prior to surgery.    ____ Take 1/2 of usual insulin dose the night before surgery and none on the morning of surgery.             _x___ Follow recommendations from Cardiologist, Pulmonologist or PCP regarding                   stopping Aspirin, Coumadin, Pllavix ,Eliquis, Effient, or Pradaxa, and Pletal.  X____Stop Anti-inflammatories such as Advil, Aleve,  Ibuprofen, Motrin, Naproxen,           Naprosyn, Goodies powders or aspirin products. OK to take Tylenol .                          _x___ Stop supplements until after surgery.  But may continue Vitamin D, Vitamin B, and multivitamin (STOP FISH OIL NOW)                   ____ Bring C-Pap to the hospital.

## 2016-03-12 NOTE — Pre-Procedure Instructions (Signed)
K+ results faxed and called to Dr. Roland Rack office (spoke to [place].)

## 2016-03-12 NOTE — Pre-Procedure Instructions (Signed)
Component Name Value Ref Range  Vent Rate (bpm) 89   PR Interval (msec) 156   QRS Interval (msec) 96   QT Interval (msec) 370   QTc (msec) 450   Result Narrative  Normal sinus rhythm Voltage criteria for left ventricular hypertrophy Abnormal ECG When compared with ECG of 19-Feb-2016 15:42, premature atrial complexes are no longer present I reviewed and concur with this report. Electronically signed [id number] MD, [place] ([id number]) on 02/21/2016 5:15:56 PM

## 2016-03-12 NOTE — Pre-Procedure Instructions (Signed)
Spoke with Dr. Roland Rack nurse Lovena Le) regarding previous CXR and EKG preformed at [facility] and stated ok to cancel and she will fax recent CXR report to place on patient chart.

## 2016-03-19 ENCOUNTER — Inpatient Hospital Stay: Payer: Medicare HMO | Admitting: Anesthesiology

## 2016-03-19 ENCOUNTER — Encounter: Payer: Self-pay | Admitting: *Deleted

## 2016-03-19 ENCOUNTER — Inpatient Hospital Stay: Payer: Medicare HMO

## 2016-03-19 ENCOUNTER — Encounter
Admission: RE | Disposition: A | Discharge status: Skilled Nursing Facility | Payer: Self-pay | Source: Ambulatory Visit | Attending: Surgery

## 2016-03-19 ENCOUNTER — Inpatient Hospital Stay
Admission: RE | Admit: 2016-03-19 | Discharge: 2016-03-21 | DRG: 470 | Disposition: A | Discharge status: Skilled Nursing Facility | Payer: Medicare HMO | Source: Ambulatory Visit | Attending: Surgery | Admitting: Surgery

## 2016-03-19 DIAGNOSIS — Z96641 Presence of right artificial hip joint: Secondary | ICD-10-CM | POA: Diagnosis not present

## 2016-03-19 DIAGNOSIS — Z8249 Family history of ischemic heart disease and other diseases of the circulatory system: Secondary | ICD-10-CM | POA: Diagnosis not present

## 2016-03-19 DIAGNOSIS — M069 Rheumatoid arthritis, unspecified: Secondary | ICD-10-CM | POA: Diagnosis not present

## 2016-03-19 DIAGNOSIS — Z7951 Long term (current) use of inhaled steroids: Secondary | ICD-10-CM | POA: Diagnosis not present

## 2016-03-19 DIAGNOSIS — Z7401 Bed confinement status: Secondary | ICD-10-CM | POA: Diagnosis not present

## 2016-03-19 DIAGNOSIS — F329 Major depressive disorder, single episode, unspecified: Secondary | ICD-10-CM | POA: Diagnosis not present

## 2016-03-19 DIAGNOSIS — Z833 Family history of diabetes mellitus: Secondary | ICD-10-CM | POA: Diagnosis not present

## 2016-03-19 DIAGNOSIS — F419 Anxiety disorder, unspecified: Secondary | ICD-10-CM | POA: Diagnosis not present

## 2016-03-19 DIAGNOSIS — M1611 Unilateral primary osteoarthritis, right hip: Principal | ICD-10-CM | POA: Diagnosis present

## 2016-03-19 DIAGNOSIS — Z836 Family history of other diseases of the respiratory system: Secondary | ICD-10-CM

## 2016-03-19 DIAGNOSIS — K219 Gastro-esophageal reflux disease without esophagitis: Secondary | ICD-10-CM | POA: Diagnosis not present

## 2016-03-19 DIAGNOSIS — M25551 Pain in right hip: Secondary | ICD-10-CM | POA: Diagnosis present

## 2016-03-19 DIAGNOSIS — J45909 Unspecified asthma, uncomplicated: Secondary | ICD-10-CM | POA: Diagnosis not present

## 2016-03-19 DIAGNOSIS — Z90711 Acquired absence of uterus with remaining cervical stump: Secondary | ICD-10-CM | POA: Diagnosis not present

## 2016-03-19 DIAGNOSIS — Z888 Allergy status to other drugs, medicaments and biological substances status: Secondary | ICD-10-CM

## 2016-03-19 DIAGNOSIS — I10 Essential (primary) hypertension: Secondary | ICD-10-CM | POA: Diagnosis not present

## 2016-03-19 DIAGNOSIS — Z8261 Family history of arthritis: Secondary | ICD-10-CM | POA: Diagnosis not present

## 2016-03-19 DIAGNOSIS — M169 Osteoarthritis of hip, unspecified: Secondary | ICD-10-CM | POA: Diagnosis not present

## 2016-03-19 DIAGNOSIS — Z79899 Other long term (current) drug therapy: Secondary | ICD-10-CM | POA: Diagnosis not present

## 2016-03-19 DIAGNOSIS — Z96653 Presence of artificial knee joint, bilateral: Secondary | ICD-10-CM | POA: Diagnosis present

## 2016-03-19 DIAGNOSIS — M25559 Pain in unspecified hip: Secondary | ICD-10-CM | POA: Diagnosis not present

## 2016-03-19 DIAGNOSIS — D649 Anemia, unspecified: Secondary | ICD-10-CM | POA: Diagnosis not present

## 2016-03-19 DIAGNOSIS — Z471 Aftercare following joint replacement surgery: Secondary | ICD-10-CM | POA: Diagnosis not present

## 2016-03-19 HISTORY — PX: TOTAL HIP ARTHROPLASTY: SHX124

## 2016-03-19 LAB — POCT I-STAT 4, (NA,K, GLUC, HGB,HCT)
Glucose, Bld: 101 mg/dL — ABNORMAL HIGH (ref 65–99)
HCT: 38 % (ref 36.0–46.0)
Hemoglobin: 12.9 g/dL (ref 12.0–15.0)
Potassium: 4.4 mmol/L (ref 3.5–5.1)
Sodium: 141 mmol/L (ref 135–145)

## 2016-03-19 SURGERY — ARTHROPLASTY, HIP, TOTAL,POSTERIOR APPROACH
Anesthesia: Spinal | Site: Hip | Laterality: Right | Wound class: Clean

## 2016-03-19 MED ORDER — KETOROLAC TROMETHAMINE 15 MG/ML IJ SOLN
30.0000 mg | Freq: Once | INTRAMUSCULAR | Status: AC
  Administered 2016-03-20: 30 mg via INTRAVENOUS
  Filled 2016-03-19: qty 1
  Filled 2016-03-19: qty 2

## 2016-03-19 MED ORDER — METOCLOPRAMIDE HCL 10 MG PO TABS: 5.0000 mg | ORAL_TABLET | Freq: Three times a day (TID) | ORAL | Status: DC | PRN

## 2016-03-19 MED ORDER — FENTANYL CITRATE (PF) 100 MCG/2ML IJ SOLN
INTRAMUSCULAR | Status: DC | PRN
Start: 2016-03-19 — End: 2016-03-19
  Administered 2016-03-19: 50 ug via INTRAVENOUS
  Administered 2016-03-19: 100 ug via INTRAVENOUS
  Administered 2016-03-19: 50 ug via INTRAVENOUS
  Administered 2016-03-19: 100 ug via INTRAVENOUS
  Administered 2016-03-19: 150 ug via INTRAVENOUS

## 2016-03-19 MED ORDER — ENOXAPARIN SODIUM 40 MG/0.4ML ~~LOC~~ SOLN
40.0000 mg | Freq: Every day | SUBCUTANEOUS | Status: DC
  Administered 2016-03-20: 40 mg via SUBCUTANEOUS
  Filled 2016-03-19: qty 0.4

## 2016-03-19 MED ORDER — NEOMYCIN-POLYMYXIN B GU 40-200000 IR SOLN
Status: AC
  Filled 2016-03-19: qty 20

## 2016-03-19 MED ORDER — LORATADINE 10 MG PO TABS: 10.0000 mg | ORAL_TABLET | Freq: Every day | ORAL | Status: DC | PRN

## 2016-03-19 MED ORDER — FLEET ENEMA 7-19 GM/118ML RE ENEM: 1.0000 | ENEMA | Freq: Once | RECTAL | Status: DC | PRN

## 2016-03-19 MED ORDER — NEOMYCIN-POLYMYXIN B GU 40-200000 IR SOLN
Status: DC | PRN
  Administered 2016-03-19: 16 mL

## 2016-03-19 MED ORDER — FAMOTIDINE 20 MG PO TABS
20.0000 mg | ORAL_TABLET | Freq: Once | ORAL | Status: AC
  Administered 2016-03-19: 20 mg via ORAL

## 2016-03-19 MED ORDER — TRANEXAMIC ACID 1000 MG/10ML IV SOLN
INTRAVENOUS | Status: DC | PRN
  Administered 2016-03-19: 1000 mg via TOPICAL

## 2016-03-19 MED ORDER — MIDAZOLAM HCL 2 MG/2ML IJ SOLN
INTRAMUSCULAR | Status: AC
  Filled 2016-03-19: qty 2

## 2016-03-19 MED ORDER — ONDANSETRON HCL 4 MG/2ML IJ SOLN: 4.0000 mg | Freq: Four times a day (QID) | INTRAMUSCULAR | Status: DC | PRN

## 2016-03-19 MED ORDER — SODIUM CHLORIDE 0.9 % IV SOLN
INTRAVENOUS | Status: DC | PRN
  Administered 2016-03-19: 25 ug/min via INTRAVENOUS

## 2016-03-19 MED ORDER — TIOTROPIUM BROMIDE MONOHYDRATE 18 MCG IN CAPS
18.0000 ug | ORAL_CAPSULE | Freq: Every day | RESPIRATORY_TRACT | Status: DC
  Administered 2016-03-19 – 2016-03-20 (×2): 18 ug via RESPIRATORY_TRACT
  Filled 2016-03-19: qty 5

## 2016-03-19 MED ORDER — FOLIC ACID 1 MG PO TABS
1.0000 mg | ORAL_TABLET | Freq: Every day | ORAL | Status: DC
  Administered 2016-03-20 – 2016-03-21 (×2): 1 mg via ORAL
  Filled 2016-03-19: qty 1

## 2016-03-19 MED ORDER — KETOROLAC TROMETHAMINE 15 MG/ML IJ SOLN
15.0000 mg | Freq: Four times a day (QID) | INTRAMUSCULAR | Status: AC
  Administered 2016-03-19 – 2016-03-20 (×3): 15 mg via INTRAVENOUS
  Filled 2016-03-19 (×4): qty 1

## 2016-03-19 MED ORDER — AMLODIPINE BESYLATE 10 MG PO TABS
10.0000 mg | ORAL_TABLET | Freq: Every day | ORAL | Status: DC
  Administered 2016-03-20 – 2016-03-21 (×2): 10 mg via ORAL
  Filled 2016-03-19: qty 1

## 2016-03-19 MED ORDER — LABETALOL HCL 100 MG PO TABS
100.0000 mg | ORAL_TABLET | Freq: Two times a day (BID) | ORAL | Status: DC
Start: 2016-03-19 — End: 2016-03-21
  Administered 2016-03-20 – 2016-03-21 (×3): 100 mg via ORAL
  Filled 2016-03-19 (×4): qty 1

## 2016-03-19 MED ORDER — HYDRALAZINE HCL 10 MG PO TABS
10.0000 mg | ORAL_TABLET | Freq: Three times a day (TID) | ORAL | Status: DC
  Administered 2016-03-20 – 2016-03-21 (×4): 10 mg via ORAL
  Filled 2016-03-19 (×7): qty 1

## 2016-03-19 MED ORDER — FAMOTIDINE 20 MG PO TABS
ORAL_TABLET | ORAL | Status: AC
  Administered 2016-03-19: 20 mg via ORAL
  Filled 2016-03-19: qty 1

## 2016-03-19 MED ORDER — METOCLOPRAMIDE HCL 5 MG/ML IJ SOLN
5.0000 mg | Freq: Three times a day (TID) | INTRAMUSCULAR | Status: DC | PRN
  Administered 2016-03-19: 10 mg via INTRAVENOUS
  Filled 2016-03-19: qty 2

## 2016-03-19 MED ORDER — FENTANYL CITRATE (PF) 100 MCG/2ML IJ SOLN
INTRAMUSCULAR | Status: AC
  Filled 2016-03-19: qty 2

## 2016-03-19 MED ORDER — DIPHENHYDRAMINE HCL 12.5 MG/5ML PO ELIX: 12.5000 mg | ORAL_SOLUTION | ORAL | Status: DC | PRN

## 2016-03-19 MED ORDER — DOCUSATE SODIUM 100 MG PO CAPS
100.0000 mg | ORAL_CAPSULE | Freq: Two times a day (BID) | ORAL | Status: DC
  Administered 2016-03-19 – 2016-03-21 (×4): 100 mg via ORAL
  Filled 2016-03-19 (×3): qty 1

## 2016-03-19 MED ORDER — SODIUM CHLORIDE 0.9 % IJ SOLN
INTRAMUSCULAR | Status: AC
  Filled 2016-03-19: qty 50

## 2016-03-19 MED ORDER — LIDOCAINE HCL (PF) 2 % IJ SOLN
INTRAMUSCULAR | Status: AC
  Filled 2016-03-19: qty 2

## 2016-03-19 MED ORDER — EPHEDRINE SULFATE 50 MG/ML IJ SOLN
INTRAMUSCULAR | Status: DC | PRN
  Administered 2016-03-19: 5 mg via INTRAVENOUS

## 2016-03-19 MED ORDER — ALBUTEROL SULFATE (2.5 MG/3ML) 0.083% IN NEBU: 2.5000 mg | INHALATION_SOLUTION | RESPIRATORY_TRACT | Status: DC | PRN

## 2016-03-19 MED ORDER — PROPOFOL 500 MG/50ML IV EMUL
INTRAVENOUS | Status: DC | PRN
  Administered 2016-03-19: 100 ug/kg/min via INTRAVENOUS

## 2016-03-19 MED ORDER — PHENYLEPHRINE HCL 10 MG/ML IJ SOLN
INTRAMUSCULAR | Status: AC
  Filled 2016-03-19: qty 1

## 2016-03-19 MED ORDER — TIOTROPIUM BROMIDE MONOHYDRATE 18 MCG IN CAPS
18.0000 ug | ORAL_CAPSULE | Freq: Every day | RESPIRATORY_TRACT | Status: DC
  Filled 2016-03-19: qty 5

## 2016-03-19 MED ORDER — BUPIVACAINE-EPINEPHRINE (PF) 0.25% -1:200000 IJ SOLN
INTRAMUSCULAR | Status: AC
  Filled 2016-03-19: qty 30

## 2016-03-19 MED ORDER — LACTATED RINGERS IV SOLN
INTRAVENOUS | Status: DC
  Administered 2016-03-19: 12:00:00 via INTRAVENOUS
  Administered 2016-03-19: 25 mL/h via INTRAVENOUS
  Administered 2016-03-19: 11:00:00 via INTRAVENOUS

## 2016-03-19 MED ORDER — PANTOPRAZOLE SODIUM 40 MG PO TBEC
40.0000 mg | DELAYED_RELEASE_TABLET | Freq: Two times a day (BID) | ORAL | Status: DC
  Administered 2016-03-19 – 2016-03-21 (×4): 40 mg via ORAL
  Filled 2016-03-19 (×3): qty 1

## 2016-03-19 MED ORDER — HYDROMORPHONE HCL 1 MG/ML IJ SOLN
1.0000 mg | INTRAMUSCULAR | Status: DC | PRN
  Administered 2016-03-20: 1 mg via INTRAVENOUS
  Filled 2016-03-19: qty 1

## 2016-03-19 MED ORDER — BUPIVACAINE-EPINEPHRINE (PF) 0.25% -1:200000 IJ SOLN
INTRAMUSCULAR | Status: DC | PRN
  Administered 2016-03-19: 30 mL via PERINEURAL

## 2016-03-19 MED ORDER — CEFAZOLIN SODIUM-DEXTROSE 2-4 GM/100ML-% IV SOLN
2.0000 g | Freq: Once | INTRAVENOUS | Status: AC
  Administered 2016-03-19: 2 g via INTRAVENOUS

## 2016-03-19 MED ORDER — ADULT MULTIVITAMIN W/MINERALS CH
1.0000 | ORAL_TABLET | Freq: Every day | ORAL | Status: DC
  Filled 2016-03-19: qty 1

## 2016-03-19 MED ORDER — CEFAZOLIN SODIUM 10 G IJ SOLR
3.0000 g | Freq: Four times a day (QID) | INTRAMUSCULAR | Status: AC
  Administered 2016-03-19 – 2016-03-20 (×3): 3 g via INTRAVENOUS
  Filled 2016-03-19 (×3): qty 3000

## 2016-03-19 MED ORDER — EPHEDRINE 5 MG/ML INJ
INTRAVENOUS | Status: AC
  Filled 2016-03-19: qty 10

## 2016-03-19 MED ORDER — ACETAMINOPHEN 10 MG/ML IV SOLN
INTRAVENOUS | Status: AC
  Filled 2016-03-19: qty 100

## 2016-03-19 MED ORDER — ONDANSETRON HCL 4 MG/2ML IJ SOLN
INTRAMUSCULAR | Status: AC
  Filled 2016-03-19: qty 2

## 2016-03-19 MED ORDER — ACETAMINOPHEN 500 MG PO TABS
1000.0000 mg | ORAL_TABLET | Freq: Four times a day (QID) | ORAL | Status: AC
  Administered 2016-03-19 – 2016-03-20 (×4): 1000 mg via ORAL
  Filled 2016-03-19 (×5): qty 2

## 2016-03-19 MED ORDER — MAGNESIUM HYDROXIDE 400 MG/5ML PO SUSP
30.0000 mL | Freq: Every day | ORAL | Status: DC | PRN
  Administered 2016-03-20 – 2016-03-21 (×2): 30 mL via ORAL
  Filled 2016-03-19 (×2): qty 30

## 2016-03-19 MED ORDER — MONTELUKAST SODIUM 10 MG PO TABS
10.0000 mg | ORAL_TABLET | Freq: Every day | ORAL | Status: DC
  Administered 2016-03-20 – 2016-03-21 (×3): 10 mg via ORAL
  Filled 2016-03-19 (×2): qty 1

## 2016-03-19 MED ORDER — OXYCODONE HCL 5 MG PO TABS
5.0000 mg | ORAL_TABLET | ORAL | Status: DC | PRN
  Administered 2016-03-19: 10 mg via ORAL
  Administered 2016-03-19: 5 mg via ORAL
  Administered 2016-03-20 – 2016-03-21 (×6): 10 mg via ORAL
  Filled 2016-03-19 (×2): qty 2
  Filled 2016-03-19: qty 1
  Filled 2016-03-19 (×6): qty 2

## 2016-03-19 MED ORDER — OMEGA-3-ACID ETHYL ESTERS 1 G PO CAPS
2.0000 g | ORAL_CAPSULE | Freq: Every day | ORAL | Status: DC
  Administered 2016-03-20 – 2016-03-21 (×2): 2 g via ORAL
  Filled 2016-03-19: qty 1
  Filled 2016-03-19: qty 2

## 2016-03-19 MED ORDER — ACETAMINOPHEN 325 MG PO TABS: 650.0000 mg | ORAL_TABLET | Freq: Four times a day (QID) | ORAL | Status: DC | PRN

## 2016-03-19 MED ORDER — BUPIVACAINE HCL (PF) 0.5 % IJ SOLN
INTRAMUSCULAR | Status: DC | PRN
  Administered 2016-03-19: 3 mL

## 2016-03-19 MED ORDER — FENTANYL CITRATE (PF) 250 MCG/5ML IJ SOLN
INTRAMUSCULAR | Status: AC
  Filled 2016-03-19: qty 5

## 2016-03-19 MED ORDER — FENTANYL CITRATE (PF) 100 MCG/2ML IJ SOLN
25.0000 ug | INTRAMUSCULAR | Status: DC | PRN
  Administered 2016-03-19 (×2): 50 ug via INTRAVENOUS

## 2016-03-19 MED ORDER — PROPOFOL 500 MG/50ML IV EMUL
INTRAVENOUS | Status: AC
  Filled 2016-03-19: qty 50

## 2016-03-19 MED ORDER — PROPOFOL 10 MG/ML IV BOLUS
INTRAVENOUS | Status: DC | PRN
  Administered 2016-03-19: 30 mg via INTRAVENOUS
  Administered 2016-03-19 (×2): 20 mg via INTRAVENOUS
  Administered 2016-03-19: 10 mg via INTRAVENOUS
  Administered 2016-03-19: 150 mg via INTRAVENOUS
  Administered 2016-03-19: 50 mg via INTRAVENOUS
  Administered 2016-03-19: 10 mg via INTRAVENOUS

## 2016-03-19 MED ORDER — ROCURONIUM BROMIDE 100 MG/10ML IV SOLN
INTRAVENOUS | Status: DC | PRN
  Administered 2016-03-19: 10 mg via INTRAVENOUS
  Administered 2016-03-19: 40 mg via INTRAVENOUS
  Administered 2016-03-19: 50 mg via INTRAVENOUS

## 2016-03-19 MED ORDER — BISACODYL 10 MG RE SUPP
10.0000 mg | Freq: Every day | RECTAL | Status: DC | PRN
  Administered 2016-03-21: 10 mg via RECTAL
  Filled 2016-03-19 (×2): qty 1

## 2016-03-19 MED ORDER — DEXAMETHASONE SODIUM PHOSPHATE 10 MG/ML IJ SOLN
INTRAMUSCULAR | Status: AC
  Filled 2016-03-19: qty 1

## 2016-03-19 MED ORDER — ALBUTEROL SULFATE HFA 108 (90 BASE) MCG/ACT IN AERS: 1.0000 | INHALATION_SPRAY | RESPIRATORY_TRACT | Status: DC | PRN

## 2016-03-19 MED ORDER — MIDAZOLAM HCL 5 MG/5ML IJ SOLN
INTRAMUSCULAR | Status: DC | PRN
  Administered 2016-03-19: 2 mg via INTRAVENOUS

## 2016-03-19 MED ORDER — FLUTICASONE PROPIONATE 50 MCG/ACT NA SUSP
1.0000 | Freq: Every day | NASAL | Status: DC
  Administered 2016-03-20 – 2016-03-21 (×2): 1 via NASAL
  Filled 2016-03-19: qty 16

## 2016-03-19 MED ORDER — KCL IN DEXTROSE-NACL 20-5-0.9 MEQ/L-%-% IV SOLN
INTRAVENOUS | Status: DC
  Administered 2016-03-19 – 2016-03-20 (×2): via INTRAVENOUS
  Filled 2016-03-19 (×7): qty 1000

## 2016-03-19 MED ORDER — ONDANSETRON HCL 4 MG/2ML IJ SOLN
INTRAMUSCULAR | Status: DC | PRN
  Administered 2016-03-19: 4 mg via INTRAVENOUS

## 2016-03-19 MED ORDER — TRANEXAMIC ACID 1000 MG/10ML IV SOLN
INTRAVENOUS | Status: AC
  Filled 2016-03-19: qty 10

## 2016-03-19 MED ORDER — ACETAMINOPHEN 10 MG/ML IV SOLN
INTRAVENOUS | Status: DC | PRN
  Administered 2016-03-19: 1000 mg via INTRAVENOUS

## 2016-03-19 MED ORDER — SUGAMMADEX SODIUM 200 MG/2ML IV SOLN
INTRAVENOUS | Status: DC | PRN
  Administered 2016-03-19: 50 mg via INTRAVENOUS

## 2016-03-19 MED ORDER — HYDROCHLOROTHIAZIDE 25 MG PO TABS
25.0000 mg | ORAL_TABLET | Freq: Every day | ORAL | Status: DC
  Administered 2016-03-20 – 2016-03-21 (×2): 25 mg via ORAL
  Filled 2016-03-19: qty 1

## 2016-03-19 MED ORDER — DEXAMETHASONE SODIUM PHOSPHATE 4 MG/ML IJ SOLN
INTRAMUSCULAR | Status: DC | PRN
  Administered 2016-03-19: 10 mg via INTRAVENOUS

## 2016-03-19 MED ORDER — SODIUM CHLORIDE 0.9 % IV SOLN
INTRAVENOUS | Status: DC | PRN
  Administered 2016-03-19: 60 mL

## 2016-03-19 MED ORDER — ONDANSETRON HCL 4 MG PO TABS: 4.0000 mg | ORAL_TABLET | Freq: Four times a day (QID) | ORAL | Status: DC | PRN

## 2016-03-19 MED ORDER — LIDOCAINE HCL (CARDIAC) 20 MG/ML IV SOLN
INTRAVENOUS | Status: DC | PRN
  Administered 2016-03-19: 100 mg via INTRAVENOUS

## 2016-03-19 MED ORDER — ACETAMINOPHEN 650 MG RE SUPP: 650.0000 mg | Freq: Four times a day (QID) | RECTAL | Status: DC | PRN

## 2016-03-19 MED ORDER — GLYCOPYRROLATE 0.2 MG/ML IJ SOLN
INTRAMUSCULAR | Status: AC
  Filled 2016-03-19: qty 1

## 2016-03-19 MED ORDER — BUPIVACAINE LIPOSOME 1.3 % IJ SUSP
INTRAMUSCULAR | Status: AC
  Filled 2016-03-19: qty 20

## 2016-03-19 SURGICAL SUPPLY — 68 items
ADAPTER BIOLOX TAPER -3 (Miscellaneous) IMPLANT
ADAPTER BIOLOX TAPER -3MM (Miscellaneous) IMPLANT
BAG DECANTER FOR FLEXI CONT (MISCELLANEOUS) IMPLANT
BIT DRILL RINGLOC QUICK CONN (BIT) ×1 IMPLANT
BLADE SAGITTAL WIDE XTHICK NO (BLADE) ×3 IMPLANT
BLADE SURG SZ20 CARB STEEL (BLADE) ×3 IMPLANT
BNDG COHESIVE 4X5 TAN STRL (GAUZE/BANDAGES/DRESSINGS) ×3 IMPLANT
CANISTER SUCT 1200ML W/VALVE (MISCELLANEOUS) ×3 IMPLANT
CANISTER SUCT 3000ML (MISCELLANEOUS) ×6 IMPLANT
CAPT HIP TOTAL 2 ×3 IMPLANT
CATH TRAY METER 16FR LF (MISCELLANEOUS) ×3 IMPLANT
CHLORAPREP W/TINT 26ML (MISCELLANEOUS) ×3 IMPLANT
COVER MAYO STAND STRL (DRAPES) ×3 IMPLANT
CUP MEDICINE 2OZ PLAST GRAD ST (MISCELLANEOUS) ×3 IMPLANT
DRAPE IMP U-DRAPE 54X76 (DRAPES) ×3 IMPLANT
DRAPE INCISE IOBAN 66X60 STRL (DRAPES) ×3 IMPLANT
DRAPE SHEET LG 3/4 BI-LAMINATE (DRAPES) ×3 IMPLANT
DRAPE SURG 17X11 SM STRL (DRAPES) ×6 IMPLANT
DRAPE TABLE BACK 80X90 (DRAPES) ×3 IMPLANT
DRILL BIT ×3 IMPLANT
DRILL BIT RINGLOC QUICK CONN (BIT) ×2
DRSG OPSITE POSTOP 4X10 (GAUZE/BANDAGES/DRESSINGS) ×3 IMPLANT
DRSG OPSITE POSTOP 4X12 (GAUZE/BANDAGES/DRESSINGS) ×3 IMPLANT
DRSG OPSITE POSTOP 4X14 (GAUZE/BANDAGES/DRESSINGS) ×3 IMPLANT
ELECT BLADE 6.5 EXT (BLADE) ×3 IMPLANT
ELECT CAUTERY BLADE 6.4 (BLADE) ×3 IMPLANT
GAUZE PACK 2X3YD (MISCELLANEOUS) ×3 IMPLANT
GLOVE BIO SURGEON STRL SZ7.5 (GLOVE) ×12 IMPLANT
GLOVE BIO SURGEON STRL SZ8 (GLOVE) ×12 IMPLANT
GLOVE BIOGEL PI IND STRL 8 (GLOVE) ×1 IMPLANT
GLOVE BIOGEL PI INDICATOR 8 (GLOVE) ×2
GLOVE INDICATOR 8.0 STRL GRN (GLOVE) ×3 IMPLANT
GOWN STRL REUS W/ TWL LRG LVL3 (GOWN DISPOSABLE) ×2 IMPLANT
GOWN STRL REUS W/ TWL XL LVL3 (GOWN DISPOSABLE) ×1 IMPLANT
GOWN STRL REUS W/TWL LRG LVL3 (GOWN DISPOSABLE) ×4
GOWN STRL REUS W/TWL XL LVL3 (GOWN DISPOSABLE) ×2
HANDPIECE INTERPULSE COAX TIP (DISPOSABLE) ×2
HOOD PEEL AWAY FLYTE STAYCOOL (MISCELLANEOUS) ×9 IMPLANT
IV NS 100ML SINGLE PACK (IV SOLUTION) ×3 IMPLANT
KIT RM TURNOVER STRD PROC AR (KITS) ×3 IMPLANT
NDL SAFETY 18GX1.5 (NEEDLE) ×3 IMPLANT
NEEDLE FILTER BLUNT 18X 1/2SAF (NEEDLE) ×2
NEEDLE FILTER BLUNT 18X1 1/2 (NEEDLE) ×1 IMPLANT
NEEDLE SPNL 20GX3.5 QUINCKE YW (NEEDLE) ×3 IMPLANT
NS IRRIG 1000ML POUR BTL (IV SOLUTION) ×3 IMPLANT
PACK HIP PROSTHESIS (MISCELLANEOUS) ×3 IMPLANT
PILLOW ABDUC SM (MISCELLANEOUS) ×3 IMPLANT
PIN STEINMANN 3/16X9 BAY 6PK (Pin) ×1 IMPLANT
PRESSURIZER CEMENT PROX FEM SM (MISCELLANEOUS) IMPLANT
SET HNDPC FAN SPRY TIP SCT (DISPOSABLE) ×1 IMPLANT
SOL .9 NS 3000ML IRR  AL (IV SOLUTION) ×2
SOL .9 NS 3000ML IRR UROMATIC (IV SOLUTION) ×1 IMPLANT
SPONGE LAP 18X18 5 PK (GAUZE/BANDAGES/DRESSINGS) IMPLANT
SPONGE XRAY 4X4 16PLY STRL (MISCELLANEOUS) ×3 IMPLANT
ST PIN 3/16X9 BAY 6PK (Pin) ×3 IMPLANT
STAPLER SKIN PROX 35W (STAPLE) ×3 IMPLANT
SUT ETHIBOND #5 BRAIDED 30INL (SUTURE) IMPLANT
SUT FIBERWIRE #2 38 BLUE 1/2 (SUTURE) ×9
SUT VIC AB 0 CT1 36 (SUTURE) ×12 IMPLANT
SUT VIC AB 1 CT1 36 (SUTURE) IMPLANT
SUT VIC AB 2-0 CT1 (SUTURE) ×12 IMPLANT
SUTURE FIBERWR #2 38 BLUE 1/2 (SUTURE) ×3 IMPLANT
SYR 20CC LL (SYRINGE) ×3 IMPLANT
SYR 30ML LL (SYRINGE) ×9 IMPLANT
SYR TB 1ML 27GX1/2 LL (SYRINGE) IMPLANT
SYRINGE 10CC LL (SYRINGE) ×3 IMPLANT
TAPE TRANSPORE STRL 2 31045 (GAUZE/BANDAGES/DRESSINGS) ×3 IMPLANT
TIP COAXIAL FEMORAL CANAL (MISCELLANEOUS) ×3 IMPLANT

## 2016-03-19 NOTE — Progress Notes (Signed)
Admission: Patient is alert and oriented and able to verbalize needs. Medicated for 10/10 pain in R hip surgical site. Skin check completed by two nurses. IVF running at 19ml/hr. Foley intact and draining. Dressing to R hip dry and intact. Lung sounds clear, heart sounds normal. Patient oriented to call bell system. Currently in bed resting. Will continue to monitor.

## 2016-03-19 NOTE — Evaluation (Signed)
Physical Therapy Evaluation Patient Details Name: Michelle Dalton MRN: [id number] DOB: [birth year] Today's Date: 03/19/2016   History of Present Illness  Pt is a 56 yo female s/p R THR posterior approach, has history of bilat TKR   Clinical Impression  Pt willing to participate in PT evaluation stated pain was 7/10 and she received pain meds prior to session. Pt lives alone and is ambulatory w/ a SPC at baseline. She displayed decreased R LE strength and decreased overall activity tolerance secondary to pain and recent surgery. Pt stated she felt extremely dizzy and light headed in supine and adamantly refused to attempt bed mobility and transfers this afternoon despite encouragement. Dizziness and light headedness most likely secondary to post anesthesia/surgery and medications. Educated patient on importance of therapeutic exercises in bed and post hip precautions. Overall patient is limited in safe mobility due to decreased strength, ROM and activity tolerance. She will benefit from skilled PT to correct deficits; recommend she dc to STR following acute hospital stay.      Follow Up Recommendations SNF    Equipment Recommendations       Recommendations for Other Services OT consult     Precautions / Restrictions Precautions Precautions: Posterior Hip;Fall Precaution Booklet Issued: No Restrictions Weight Bearing Restrictions: Yes RLE Weight Bearing: Weight bearing as tolerated Other Position/Activity Restrictions: no hip adduction past midline, no hip IR past neutral and no hip flexion past 90 degrees      Mobility  Bed Mobility               General bed mobility comments: not assessed patient refused due to feeling light headed and dizzy  Transfers                 General transfer comment: not assessed patient refused due to feeling light headed and dizzy  Ambulation/Gait             General Gait Details: not assessed patient refused due to feeling light  headed and dizzy  Stairs            Wheelchair Mobility    Modified Rankin (Stroke Patients Only)       Balance                                             Pertinent Vitals/Pain Pain Assessment: 0-10 Pain Score: 7  Pain Location: R hip Pain Descriptors / Indicators: Aching Pain Intervention(s): Monitored during session;Limited activity within patient's tolerance;Premedicated before session    Home Living Family/patient expects to be discharged to:: Private residence Living Arrangements: Alone   Type of Home: House Home Access: Stairs to enter Entrance Stairs-Rails: None Entrance Stairs-Number of Steps: 2 Home Layout: One level Home Equipment: Environmental consultant - 2 wheels;Cane - single point;Bedside commode      Prior Function Level of Independence: Independent with assistive device(s)         Comments: uses SPC at baseline to ambulate, cares for herselft at home performs all ADLs and IADLs, is on disability     Hand Dominance        Extremity/Trunk Assessment   Upper Extremity Assessment Upper Extremity Assessment: Overall WFL for tasks assessed    Lower Extremity Assessment Lower Extremity Assessment: RLE deficits/detail RLE Deficits / Details: increase pain at surgical site, decreased R hip strength grossly 3/5, sensation intact  Communication   Communication: No difficulties  Cognition Arousal/Alertness: Awake/alert Behavior During Therapy: WFL for tasks assessed/performed Overall Cognitive Status: Within Functional Limits for tasks assessed                      General Comments      Exercises Total Joint Exercises Ankle Circles/Pumps: AROM;Strengthening;Both;10 reps;Supine Quad Sets: AROM;Strengthening;10 reps;Supine;Right Gluteal Sets: AROM;Strengthening;Both;10 reps;Supine Heel Slides: AAROM;Strengthening;Right;10 reps;Supine (limited ROM required min assist to complete) Hip ABduction/ADduction:  AAROM;Strengthening;Right;10 reps;Supine (limited ROM hip abd only, required min assist to complete) Straight Leg Raises: AAROM;Strengthening;Right;10 reps;Supine (limited ROM, min assist )   Assessment/Plan    PT Assessment Patient needs continued PT services  PT Problem List Decreased strength;Decreased range of motion;Decreased activity tolerance;Decreased balance;Decreased mobility;Decreased knowledge of use of DME;Decreased knowledge of precautions       PT Treatment Interventions DME instruction;Gait training;Stair training;Functional mobility training;Balance training;Therapeutic exercise;Therapeutic activities;Patient/family education    PT Goals (Current goals can be found in the Care Plan section)  Acute Rehab PT Goals Patient Stated Goal: To get stronger  PT Goal Formulation: With patient Time For Goal Achievement: 04/02/16 Potential to Achieve Goals: Good    Frequency BID   Barriers to discharge Decreased caregiver support pt lives alone and does not have help at discharge     Co-evaluation               End of Session   Activity Tolerance: Patient limited by pain;Treatment limited secondary to medical complications (Comment) (dizziness and light headed) Patient left: in bed;with call bell/phone within reach;with bed alarm set Nurse Communication: Mobility status PT Visit Diagnosis: Muscle weakness (generalized) (M62.81);Unsteadiness on feet (R26.81);Pain Pain - Right/Left: Right Pain - part of body: Hip         Time: 3614-4315 PT Time Calculation (min) (ACUTE ONLY): 18 min   Charges:         PT G Codes:         [employer] Student PT 03/19/2016, 4:19 PM

## 2016-03-19 NOTE — Progress Notes (Signed)
15 minute call to [employer].

## 2016-03-19 NOTE — Anesthesia Post-op Follow-up Note (Cosign Needed)
Anesthesia QCDR form completed.        

## 2016-03-19 NOTE — NC FL2 (Signed)
[place] LEVEL OF CARE SCREENING TOOL     IDENTIFICATION  Patient Name: Michelle Dalton Birthdate: 1960-09-18 Sex: female Admission Date (Current Location): 03/19/2016  [facility] and [place] Number:  Michelle Dalton  ([id number] [facility]) Facility and Address:  [facility], [address] [place], [place] [address]      Provider Number: [id number]  Attending Physician Name and Address:  Corky Mull, MD  Relative Name and Phone Number:       Current Level of Care: Hospital Recommended Level of Care: [place] Prior Approval Number:    Date Approved/Denied:   PASRR Number:    Discharge Plan: SNF    Current Diagnoses: Patient Active Problem List   Diagnosis Date Noted  . Status post total hip replacement, right 03/19/2016  . 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 )  (None.) Incontinent Diet (Diet: Regular)  AMBULATORY STATUS COMMUNICATION OF NEEDS Skin   Extensive Assist Verbally Surgical wounds (Incision: Right Hip)                       Personal Care Assistance Level of Assistance  Bathing, Feeding, Dressing Bathing Assistance: Limited assistance Feeding assistance: Independent Dressing Assistance: Limited assistance     Functional Limitations Info  Sight, Speech, Hearing Sight Info: Adequate Hearing Info: Adequate Speech Info: Adequate    SPECIAL CARE FACTORS FREQUENCY  PT (By licensed PT), OT (By licensed OT)     PT Frequency:  (5) OT Frequency:  (5)            Contractures      Additional Factors Info  Code Status, Allergies Code Status Info:  (Full Code) Allergies Info:  (Lisinopril)           Current Medications  (03/19/2016):  This is the current hospital active medication list Current Facility-Administered Medications  Medication Dose Route Frequency Provider Last Rate Last Dose  . acetaminophen (TYLENOL) tablet 650 mg  650 mg Oral Q6H PRN Corky Mull, MD       Or  . acetaminophen (TYLENOL) suppository 650 mg  650 mg Rectal Q6H PRN Corky Mull, MD      . acetaminophen (TYLENOL) tablet 1,000 mg  1,000 mg Oral Q6H Corky Mull, MD      . albuterol (PROVENTIL) (2.5 MG/3ML) 0.083% nebulizer solution 2.5 mg  2.5 mg Inhalation Q4H PRN Corky Mull, MD      . Derrill Memo ON 03/20/2016] amLODipine (NORVASC) tablet 10 mg  10 mg Oral Daily Corky Mull, MD      . bisacodyl (DULCOLAX) suppository 10 mg  10 mg Rectal Daily PRN Corky Mull, MD      . ceFAZolin (ANCEF) 3 g in dextrose 5 % 50 mL IVPB  3 g Intravenous Q6H Corky Mull, MD      . dextrose 5 % and 0.9 % NaCl with KCl 20 mEq/L infusion   Intravenous Continuous Corky Mull, MD 100 mL/hr at 03/19/16 1410    . diphenhydrAMINE (BENADRYL) 12.5 MG/5ML elixir 12.5-25 mg  12.5-25 mg Oral Q4H PRN Corky Mull, MD      . docusate sodium (COLACE) capsule 100 mg  100 mg  Oral BID Marshall Cork Poggi, MD      . enoxaparin (LOVENOX) injection 40 mg  40 mg Subcutaneous QHS Corky Mull, MD      . fentaNYL (SUBLIMAZE) 100 MCG/2ML injection           . [START ON 03/20/2016] fluticasone (FLONASE) 50 MCG/ACT nasal spray 1 spray  1 spray Each Nare Daily Corky Mull, MD      . folic acid (FOLVITE) tablet 1 mg  1 mg Oral Daily Corky Mull, MD      . hydrALAZINE (APRESOLINE) tablet 10 mg  10 mg Oral TID Corky Mull, MD      . hydrochlorothiazide (HYDRODIURIL) tablet 25 mg  25 mg Oral Daily Corky Mull, MD      . HYDROmorphone (DILAUDID) injection 1-2 mg  1-2 mg Intravenous Q2H PRN Corky Mull, MD      . ketorolac (TORADOL) 15 MG/ML injection 15 mg  15 mg Intravenous Q6H Corky Mull, MD   15 mg at 03/19/16 1428  . ketorolac (TORADOL) 15 MG/ML injection 30 mg  30 mg Intravenous Once  Corky Mull, MD      . labetalol (NORMODYNE) tablet 100 mg  100 mg Oral BID Corky Mull, MD      . loratadine (CLARITIN) tablet 10 mg  10 mg Oral Daily PRN Corky Mull, MD      . magnesium hydroxide (MILK OF MAGNESIA) suspension 30 mL  30 mL Oral Daily PRN Corky Mull, MD      . metoCLOPramide (REGLAN) tablet 5-10 mg  5-10 mg Oral Q8H PRN Corky Mull, MD       Or  . metoCLOPramide (REGLAN) injection 5-10 mg  5-10 mg Intravenous Q8H PRN Corky Mull, MD      . Derrill Memo ON 03/20/2016] montelukast (SINGULAIR) tablet 10 mg  10 mg Oral Daily Corky Mull, MD      . multivitamin with minerals tablet 1 tablet  1 tablet Oral Daily Corky Mull, MD      . omega-3 acid ethyl esters (LOVAZA) capsule 2 g  2 g Oral Daily Corky Mull, MD      . ondansetron [facility]) tablet 4 mg  4 mg Oral Q6H PRN Corky Mull, MD       Or  . ondansetron [facility]) injection 4 mg  4 mg Intravenous Q6H PRN Corky Mull, MD      . oxyCODONE (Oxy IR/ROXICODONE) immediate release tablet 5-10 mg  5-10 mg Oral Q3H PRN Corky Mull, MD   10 mg at 03/19/16 1428  . pantoprazole (PROTONIX) EC tablet 40 mg  40 mg Oral BID Corky Mull, MD      . sodium phosphate (FLEET) 7-19 GM/118ML enema 1 enema  1 enema Rectal Once PRN Corky Mull, MD      . Derrill Memo ON 03/20/2016] tiotropium (SPIRIVA) inhalation capsule 18 mcg  18 mcg Inhalation QHS Corky Mull, MD         Discharge Medications: Please see discharge summary for a list of discharge medications.  Relevant Imaging Results:  Relevant Lab Results:   Additional Information  (SSN: [id number])  Michelle Dalton, Student-Social Work

## 2016-03-19 NOTE — Transfer of Care (Signed)
Immediate Anesthesia Transfer of Care Note  Patient: Michelle Dalton  Procedure(s) Performed: Procedure(s): TOTAL HIP ARTHROPLASTY (Right)  Patient Location: PACU  Anesthesia Type:General  Level of Consciousness: awake, alert , oriented and patient cooperative  Airway & Oxygen Therapy: Patient Spontanous Breathing and Patient connected to nasal cannula oxygen  Post-op Assessment: Report given to RN and Post -op Vital signs reviewed and stable  Post vital signs: Reviewed and stable  Last Vitals:  Vitals:   03/19/16 0722 03/19/16 0759  BP: (!) 153/89   Pulse:  78  Resp:  18  Temp: 37.1 C     Last Pain:  Vitals:   03/19/16 0722  TempSrc: Tympanic  PainSc: 8       Patients Stated Pain Goal: 1 (36/46/80 3212)  Complications: No apparent anesthesia complications

## 2016-03-19 NOTE — Anesthesia Postprocedure Evaluation (Signed)
Anesthesia Post Note  Patient: KAILONI VAHLE  Procedure(s) Performed: Procedure(s) (LRB): TOTAL HIP ARTHROPLASTY (Right)  Patient location during evaluation: PACU Anesthesia Type: Spinal Level of consciousness: awake and alert Pain management: pain level controlled Vital Signs Assessment: post-procedure vital signs reviewed and stable Respiratory status: spontaneous breathing, nonlabored ventilation, respiratory function stable and patient connected to nasal cannula oxygen Cardiovascular status: blood pressure returned to baseline and stable Postop Assessment: no signs of nausea or vomiting Anesthetic complications: no     Last Vitals:  Vitals:   03/19/16 1355 03/19/16 1429  BP: 132/68 126/66  Pulse: 70 70  Resp: 16 18  Temp: 36.4 C 36.3 C    Last Pain:  Vitals:   03/19/16 1430  TempSrc:   PainSc: 10-Worst pain ever                 Precious Haws Angelynn Lemus

## 2016-03-19 NOTE — Op Note (Signed)
03/19/2016  12:21 PM  Patient:   Michelle Dalton  Pre-Op Diagnosis:   Degenerative joint disease, right hip.  Post-Op Diagnosis:   Same.  Procedure:   Right total hip arthroplasty.  Surgeon:   Pascal Lux, MD  Assistant:   Cameron Proud, PA-C  Anesthesia:   Spinal --> GET  Findings:   As above.  Complications:   None  EBL:   250 cc  Fluids:   2500 cc crystalloid  UOP:   300 cc  TT:   None  Drains:   None  Closure:   Staples  Implants:   Biomet press-fit system with a #14reduced proximal profile laterally offset Echo femoral stem, a 50 mm acetabular shell (secured with 2 acetabular screws) with an E-poly hi-wall liner, and a 36 mm ceramic head with a -3 mm neck adapter.  Brief Clinical Note:   The patient is a 56 year old female with a long history of right hip pain. Her symptoms have progressed despite medications, activity modification, etc. Her history and examination are consistent with advanced degenerative joint disease of the right hip, confirmed by plain radiographs. She presents at this time for a right total hip arthroplasty..   Procedure:   The patient was brought into the operating room. After attempting spinal anesthesia, adequate general endotracheal intubation and anesthesia was obtained. The patient underwent placement of a Foley catheter before she was repositioned in the left lateral decubitus position and secured using a lateral hip positioner. The right hip and lower extremity were prepped with ChloroPrep solution before being draped sterilely. Preoperative antibiotics were administered. A timeout was performed to verify the appropriate surgical site before a standard posterior approach to the hip was made through an approximately 4-5 inch incision. The incision was carried down through the subcutaneous tissues to expose the gluteal fascia and proximal end of the iliotibial band. These structures were split the length of the incision and the Charnley  self-retaining hip retractor placed. The bursal tissues were swept posteriorly to expose the short external rotators. The anterior border of the piriformis tendon was identified and this plane developed down through the capsule to enter the joint. A flap of tissue was elevated off the posterior aspect of the femoral neck and greater trochanter and retracted posteriorly. This flap included the piriformis tendon, the short external rotators, and the posterior capsule. The soft tissues were elevated off the lateral aspect of the ilium and a large Steinmann pin placed bicortically. With the right leg aligned over the left leg, a drill bit was placed into the greater trochanter parallel to the Steinmann pin and the distance between these two pins measured in order to optimize leg lengths postoperatively. The drill bit was removed and the hip dislocated. The piriformis fossa was debrided of soft tissues before the intramedullary canal was accessed through this point using a triple step reamer. The canal was reamed sequentially beginning with a #7 tapered reamer and progressing to a #14 tapered reamer. This provided excellent circumferential chatter. Using the appropriate guide, a femoral neck cut was made 10-12 mm above the lesser trochanter. The femoral head was removed.  Attention was directed to the acetabular side. The labrum was debrided circumferentially before the ligamentum teres was removed using a large curette. A line was drawn on the drapes corresponding to the native version of the acetabulum. This line was used as a guide while the acetabulum was reamed sequentially beginning with a 42 mm reamer and progressing to a 49 mm  reamer. This provided excellent circumferential chatter. The 49 mm trial acetabulum was positioned and found to fit reasonably well. Therefore, the 50 mm acetabular shell was selected and impacted into place with care taken to maintain the appropriate version. The shell was noted to be  slightly loose, so it was elected to secure the shell with two acetabular screws placed in the posterior-superior quadrant. Both screws obtained excellent purchase. The trial high wall liner was inserted.  Attention was redirected to the femoral side. A box osteotome was used to establish version before the canal was broached sequentially beginning with a #12 broach and progressing to a #14 broach. This was left in place and several trial reductions performed using both a standard and laterally offset neck options, as well as the -6 mm, the -3 mm, and the +0 mm neck lengths. The permanent E-polyethylene hi-wall liner was impacted into the acetabular shell and its locking mechanism verified using a quarter-inch osteotome. Next, the #14 reduced proximal profile lateral offset femoral stem was impacted into place with care taken to maintain the appropriate version. A repeat trial reduction was performed using the -6 mm and the -3 mm neck lengths. The -3 mm neck length demonstrated excellent stability both in extension and external rotation as well as with flexion to 90 and internal rotation beyond 70. It also was stable in the position of sleep. In addition, leg lengths appeared to be restored appropriately, both by reassessing the position of the right leg over the left, as well as by measuring the distance between the Steinmann pin and the drill bit. The 36 mm ceramic head with the -3 mm neck adapter construct was put together on the back table before being impacted onto the stem of the femoral component. The Morse taper locking mechanism was verified using manual distraction before the head was relocated and placed through a range of motion with the findings as described above.  The wound was copiously irrigated with bacitracin saline solution via the jet lavage system before the peri-incisional and pericapsular tissues were injected with 30 cc of 0.5% Sensorcaine with epinephrine and 20 cc of Exparel diluted  out to 60 cc with normal saline to help with postoperative analgesia. The posterior flap was reapproximated to the posterior aspect of the greater trochanter using #2 Fiberwire sutures placed through drill holes. The iliotibial band was reapproximated using #1 Vicryl interrupted sutures before the gluteal fascia was closed using a running #1 Vicryl suture. At this point, 1 g of transexemic acid in 10 cc of normal saline was injected into the joint to help reduce postoperative bleeding. The subcutaneous tissues were closed in several layers using #0 and 2-0 Vicryl interrupted sutures before the skin was closed using staples. A sterile occlusive dressing was applied to the wound before the patient was placed into an abduction wedge pillow. The patient was then rolled back into the supine position on her hospital bed before being awakened and returned to the recovery room in satisfactory condition after tolerating the procedure well.

## 2016-03-19 NOTE — H&P (Signed)
Paper H&P to be scanned into permanent record. H&P reviewed and patient re-examined. No changes. 

## 2016-03-19 NOTE — Anesthesia Preprocedure Evaluation (Signed)
Anesthesia Evaluation  Patient identified by MRN, date of birth, ID band Patient awake    Reviewed: Allergy & Precautions, H&P , NPO status , Patient's Chart, lab work & pertinent test results  History of Anesthesia Complications Negative for: history of anesthetic complications  Airway Mallampati: III  TM Distance: <3 FB Neck ROM: full    Dental  (+) Poor Dentition, Chipped, Missing, Partial Upper   Pulmonary neg shortness of breath, asthma , former smoker,    Pulmonary exam normal breath sounds clear to auscultation       Cardiovascular Exercise Tolerance: Good hypertension, (-) angina(-) Past MI and (-) DOE Normal cardiovascular exam Rhythm:regular Rate:Normal     Neuro/Psych PSYCHIATRIC DISORDERS Anxiety Depression negative neurological ROS     GI/Hepatic Neg liver ROS, GERD  Controlled and Medicated,  Endo/Other  negative endocrine ROS  Renal/GU      Musculoskeletal  (+) Arthritis ,   Abdominal   Peds  Hematology negative hematology ROS (+)   Anesthesia Other Findings Past Medical History: No date: Anemia No date: Anxiety No date: Arthritis     Comment: rheumatoid arthritis No date: Asthma No date: Depression No date: GERD (gastroesophageal reflux disease) No date: Hypertension No date: Seasonal allergies  Past Surgical History: No date: ABDOMINAL HYSTERECTOMY No date: ANKLE FRACTURE SURGERY Left No date: FRACTURE SURGERY Right     Comment: ANKLE with metal 2015, 2017: JOINT REPLACEMENT Bilateral     Comment: TOTAL KNEE REPLACEMENTS 02/28/2015: TOTAL KNEE ARTHROPLASTY Right     Comment: Procedure: TOTAL KNEE ARTHROPLASTY;  Surgeon:               Corky Mull, MD;  Location: [facility] ORS;                Service: Orthopedics;  Laterality: Right; No date: TUBAL LIGATION  BMI    Body Mass Index:  36.34 kg/m      Reproductive/Obstetrics negative OB ROS                              Anesthesia Physical Anesthesia Plan  ASA: III  Anesthesia Plan: Spinal   Post-op Pain Management:    Induction:   Airway Management Planned:   Additional Equipment:   Intra-op Plan:   Post-operative Plan:   Informed Consent: I have reviewed the patients History and Physical, chart, labs and discussed the procedure including the risks, benefits and alternatives for the proposed anesthesia with the patient or authorized representative who has indicated his/her understanding and acceptance.   Dental Advisory Given  Plan Discussed with: Anesthesiologist, CRNA and Surgeon  Anesthesia Plan Comments:         Anesthesia Quick Evaluation

## 2016-03-19 NOTE — Anesthesia Procedure Notes (Addendum)
Spinal  Patient location during procedure: OR Start time: 03/19/2016 9:18 AM End time: 03/19/2016 9:21 AM Staffing Anesthesiologist: Katy Fitch K Performed: anesthesiologist  Preanesthetic Checklist Completed: patient identified, site marked, surgical consent, pre-op evaluation, timeout performed, IV checked, risks and benefits discussed and monitors and equipment checked Spinal Block Patient position: sitting Prep: ChloraPrep Patient monitoring: heart rate, continuous pulse ox, blood pressure and cardiac monitor Approach: midline Location: L3-4 Injection technique: single-shot Needle Needle type: Whitacre and Introducer  Needle gauge: 24 G Needle length: 9 cm Additional Notes Negative paresthesia. Negative blood return. Positive free-flowing CSF. Expiration date of kit checked and confirmed. Patient tolerated procedure well, without complications.

## 2016-03-19 NOTE — Anesthesia Procedure Notes (Signed)
Procedure Name: Intubation Date/Time: 03/19/2016 9:45 AM Performed by: Rosaria Ferries, Zasha Belleau Pre-anesthesia Checklist: Patient identified, Emergency Drugs available, Suction available and Patient being monitored Patient Re-evaluated:Patient Re-evaluated prior to inductionOxygen Delivery Method: Circle system utilized Preoxygenation: Pre-oxygenation with 100% oxygen Intubation Type: IV induction Laryngoscope Size: Mac and 3 Grade View: Grade I Tube size: 7.0 mm Number of attempts: 2 (front right tooth prevents view of ett entering glottis) Airway Equipment and Method: Bougie stylet Secured at: 21 cm Tube secured with: Tape Dental Injury: Teeth and Oropharynx as per pre-operative assessment  Difficulty Due To: Difficult Airway- due to dentition

## 2016-03-20 ENCOUNTER — Encounter: Payer: Self-pay | Admitting: Surgery

## 2016-03-20 LAB — CBC WITH DIFFERENTIAL/PLATELET
Basophils Absolute: 0 10*3/uL (ref 0–0.1)
Basophils Relative: 0 %
EOS ABS: 0 10*3/uL (ref 0–0.7)
EOS PCT: 0 %
HCT: 30 % — ABNORMAL LOW (ref 35.0–47.0)
Hemoglobin: 9.9 g/dL — ABNORMAL LOW (ref 12.0–16.0)
LYMPHS ABS: 1.2 10*3/uL (ref 1.0–3.6)
LYMPHS PCT: 9 %
MCH: 28.8 pg (ref 26.0–34.0)
MCHC: 33.1 g/dL (ref 32.0–36.0)
MCV: 87.1 fL (ref 80.0–100.0)
MONO ABS: 1 10*3/uL — AB (ref 0.2–0.9)
MONOS PCT: 7 %
Neutro Abs: 11.9 10*3/uL — ABNORMAL HIGH (ref 1.4–6.5)
Neutrophils Relative %: 84 %
PLATELETS: 327 10*3/uL (ref 150–440)
RBC: 3.44 MIL/uL — ABNORMAL LOW (ref 3.80–5.20)
RDW: 15.7 % — ABNORMAL HIGH (ref 11.5–14.5)
WBC: 14.2 10*3/uL — ABNORMAL HIGH (ref 3.6–11.0)

## 2016-03-20 LAB — BASIC METABOLIC PANEL
Anion gap: 6 (ref 5–15)
BUN: 13 mg/dL (ref 6–20)
CALCIUM: 8.7 mg/dL — AB (ref 8.9–10.3)
CO2: 26 mmol/L (ref 22–32)
CREATININE: 0.81 mg/dL (ref 0.44–1.00)
Chloride: 106 mmol/L (ref 101–111)
GFR calc Af Amer: >60 mL/min (ref >60)
GLUCOSE: 147 mg/dL — AB (ref 65–99)
POTASSIUM: 3.9 mmol/L (ref 3.5–5.1)
Sodium: 138 mmol/L (ref 135–145)

## 2016-03-20 MED ORDER — ADULT MULTIVITAMIN W/MINERALS CH
1.0000 | ORAL_TABLET | Freq: Every day | ORAL | Status: DC
  Administered 2016-03-20 – 2016-03-21 (×2): 1 via ORAL
  Filled 2016-03-20 (×2): qty 1

## 2016-03-20 NOTE — Clinical Social Work Placement (Signed)
   CLINICAL SOCIAL WORK PLACEMENT  NOTE  Date:  03/20/2016  Patient Details  Name: Michelle Dalton MRN: [id number] Date of Birth: 1960-09-01  Clinical Social Work is seeking post-discharge placement for this patient at the [place] level of care (*CSW will initial, date and re-position this form in  chart as items are completed):  Yes   Patient/family provided with [place] Work Department's list of facilities offering this level of care within the geographic area requested by the patient (or if unable, by the patient's family).  Yes   Patient/family informed of their freedom to choose among providers that offer the needed level of care, that participate in Medicare, Medicaid or managed care program needed by the patient, have an available bed and are willing to accept the patient.  Yes   Patient/family informed of [facility]'s ownership interest in [facility] and [facility], as well as of the fact that they are under no obligation to receive care at these facilities.  PASRR submitted to EDS on 03/19/16     PASRR number received on 03/20/16     Existing PASRR number confirmed on       FL2 transmitted to all facilities in geographic area requested by pt/family on 03/20/16     FL2 transmitted to all facilities within larger geographic area on       Patient informed that his/her managed care company has contracts with or will negotiate with certain facilities, including the following:        Yes   Patient/family informed of bed offers received.  Patient chooses bed at  [facility] )     Physician recommends and patient chooses bed at      Patient to be transferred to   on  .  Patient to be transferred to facility by       Patient family notified on   of transfer.  Name of family member notified:        PHYSICIAN       Additional Comment:    _______________________________________________ Gardiner Espana, Veronia Beets,  LCSW 03/20/2016, 10:54 AM

## 2016-03-20 NOTE — Progress Notes (Signed)
Subjective: 1 Day Post-Op Procedure(s) (LRB): TOTAL HIP ARTHROPLASTY (Right) Patient reports pain as mild, much improved compared to yesterday. Patient is well, and has had no acute complaints or problems Plan is to go Skilled nursing facility after hospital stay at this time, PT to continue to evaluate, care management to assist with discharge. Negative for chest pain and shortness of breath Fever: 99 temp this AM. Gastrointestinal:Negative for nausea and vomiting  Objective: Vital signs in last 24 hours: Temp:  [97.4 F (36.3 C)-99 F (37.2 C)] 99 F (37.2 C) (03/14 0721) Pulse Rate:  [65-95] 90 (03/14 0721) Resp:  [10-18] 16 (03/14 0721) BP: (114-149)/(53-79) 140/71 (03/14 0721) SpO2:  [92 %-100 %] 94 % (03/14 0721) FiO2 (%):  [28 %] 28 % (03/13 1354) Weight:  [105.2 kg (232 lb)] 105.2 kg (232 lb) (03/13 1433)  Intake/Output from previous day:  Intake/Output Summary (Last 24 hours) at 03/20/16 0739 Last data filed at 03/20/16 0602  Gross per 24 hour  Intake          4376.67 ml  Output             2550 ml  Net          1826.67 ml    Intake/Output this shift: No intake/output data recorded.  Labs:  Recent Labs  03/19/16 0800 03/20/16 0439  HGB 12.9 9.9*    Recent Labs  03/19/16 0800 03/20/16 0439  WBC  --  14.2*  RBC  --  3.44*  HCT 38.0 30.0*  PLT  --  327    Recent Labs  03/19/16 0800 03/20/16 0439  NA 141 138  K 4.4 3.9  CL  --  106  CO2  --  26  BUN  --  13  CREATININE  --  0.81  GLUCOSE 101* 147*  CALCIUM  --  8.7*   No results for input(s): LABPT, INR in the last 72 hours.   EXAM General - Patient is Alert, Appropriate and Oriented Extremity - ABD soft Sensation intact distally Intact pulses distally Dorsiflexion/Plantar flexion intact Incision: dressing C/D/I No cellulitis present Dressing/Incision - clean, dry, no drainage Motor Function - intact, moving foot and toes well on exam.  Abdomen soft with normal BS.  Past Medical  History:  Diagnosis Date  . Anemia   . Anxiety   . Arthritis    rheumatoid arthritis  . Asthma   . Depression   . GERD (gastroesophageal reflux disease)   . Hypertension   . Seasonal allergies     Assessment/Plan: 1 Day Post-Op Procedure(s) (LRB): TOTAL HIP ARTHROPLASTY (Right) Active Problems:   Status post total hip replacement, right  Estimated body mass index is 36.34 kg/m as calculated from the following:   Height as of this encounter: 5\' 7"  (1.702 m).   Weight as of this encounter: 105.2 kg (232 lb). Advance diet Up with therapy D/C IV fluids when tolerating po intake.  Labs reviewed, WBC 14.2 this AM, low grade fever this AM, likely post-op inflammation.  Denies any SOB, abd pain or urinary symptoms this AM.  Repeat CBC and BMP tomorrow morning. Stressed the importance of therapy for the patient due to contracture present during operation. Foley to be removed today. Care management to assist with discharge. Plan on possible discharge tomorrow based on condition.  DVT Prophylaxis - Lovenox, Foot Pumps and TED hose Weight-Bearing as tolerated to right leg  J. Cameron Proud, PA-C [facility] 03/20/2016, 7:39 AM

## 2016-03-20 NOTE — Care Management Note (Signed)
Case Management Note  Patient Details  Name: Michelle Dalton MRN: [id number] Date of Birth: 11/28/1960  Subjective/Objective:                  Met with patient and her daughter to discuss discharge planning. She states she is from home alone and all her children work. She would like to go to [facility] again for rehab. She has a rolling walker and cane available for use in the home. She has used home health PT services in the past but cannot remember name of agency. She uses [facility] for medications.  Action/Plan: Home health list left with patient. PT is recommending SNF.   Expected Discharge Date:                  Expected Discharge Plan:     In-House Referral:     Discharge planning Services  CM Consult  Post Acute Care Choice:  Durable Medical Equipment, Home Health Choice offered to:  Patient, Adult Children  DME Arranged:    DME Agency:     HH Arranged:    [place] Agency:     Status of Service:  In process, will continue to follow  If discussed at Long Length of Stay Meetings, dates discussed:    Additional Comments:  Marshell Garfinkel, RN 03/20/2016, 11:49 AM

## 2016-03-20 NOTE — Clinical Social Work Note (Signed)
Clinical Social Work Assessment  Patient Details  Name: Michelle Dalton MRN: [id number] Date of Birth: 1960-12-04  Date of referral:  03/20/16               Reason for consult:  Facility Placement                Permission sought to share information with:  Chartered certified accountant granted to share information::  Yes, Verbal Permission Granted  Name::      [name]::   [name]   Relationship::     Contact Information:     Housing/Transportation Living arrangements for the past 2 months:  [name] of Information:  Patient, Adult Children Patient Interpreter Needed:  None Criminal Activity/Legal Involvement Pertinent to Current Situation/Hospitalization:  No - Comment as needed Significant Relationships:  Adult Children Lives with:  Roommate Do you feel safe going back to the place where you live?  Yes Need for family participation in patient care:  Yes (Comment)  Care giving concerns:  Patient lives in [place] alone.    Social Worker assessment / plan:  Holiday representative (CSW) received SNF consult. PT is recommending SNF. CSW met with patient and her youngest daughter Michelle Dalton was at her bedside. Patient was familiar with CSW from previous admission when she had her knee surgery. Per patient she went to [facility] last time for her knee and would like to go back there again. Patient reported that she lives alone in [place] and has 3 adult children, 2 girls and 1 boy. CSW explained that [facility] Rossetti will have to approve SNF stay. Patient verbalized her understanding. FL2 complete and faxed out. PASARR is received. CSW presented bed offers and patient chose [facility]. Per [facility] admissions coordinator at [facility] they will accept patient and will start [place] authorization. CSW will continue to follow and assist as needed.     Employment status:  Retired Nurse, adult PT  Recommendations:  [place] / Referral to community resources:  [place]  Patient/Family's Response to care:  Patient is agreeable to going to [facility].   Patient/Family's Understanding of and Emotional Response to Diagnosis, Current Treatment, and Prognosis: Patient was very pleasant and thanked CSW for assistance.   Emotional Assessment Appearance:  Appears stated age Attitude/Demeanor/Rapport:    Affect (typically observed):  Accepting, Adaptable, Pleasant Orientation:  Oriented to Self, Oriented to Place, Oriented to  Time, Oriented to Situation Alcohol / Substance use:  Not Applicable Psych involvement (Current and /or in the community):  No (Comment)  Discharge Needs  Concerns to be addressed:  Discharge Planning Concerns Readmission within the last 30 days:  No Current discharge risk:  Dependent with Mobility Barriers to Discharge:  Continued Medical Work up   [employer], Michelle Beets, LCSW 03/20/2016, 10:55 AM

## 2016-03-20 NOTE — Plan of Care (Signed)
Problem: Activity: Goal: Risk for activity intolerance will decrease Outcome: Progressing Patient tolerating PT well and getting up to bedside commode and to the chair without incident. Will continue to monitor.

## 2016-03-20 NOTE — Progress Notes (Signed)
Physical Therapy Treatment Patient Details Name: Michelle Dalton MRN: [id number] DOB: 05-14-1960 Today's Date: 03/20/2016    History of Present Illness Pt is a 56 yo female s/p R THR posterior approach, has history of bilat TKR     PT Comments    Pt sitting in recliner upon entrance with pain under control and demonstrated good participation throughout PT treatment. Advanced therex to standing exercises and she required use of RW and min guarding to safely perform therex, cuing for proper technique. She displayed improved activity tolerance with ambulation and was able to ambulate to nursing station w/ RW and min guarding. Patient progressed to step through gait pattern with verbal cuing but still displays increased pain in the R hip during stance on the R LE. Overall patient is progressing in mobility but still limited due to decrease strength, ROM and activity tolerance as well as increased pain. She will continue to benefit from skilled PT, continue to recommend STR following acute hospitalization.     Follow Up Recommendations  SNF     Equipment Recommendations       Recommendations for Other Services OT consult     Precautions / Restrictions Precautions Precautions: Posterior Hip;Fall Restrictions Weight Bearing Restrictions: Yes RLE Weight Bearing: Weight bearing as tolerated Other Position/Activity Restrictions: no hip adduction past midline, no hip IR past neutral and no hip flexion past 90 degrees    Mobility  Bed Mobility Overal bed mobility: Needs Assistance Bed Mobility: Sit to Supine       Sit to supine: Min assist   General bed mobility comments: requires min assist to advance R LE into bed   Transfers Overall transfer level: Needs assistance Equipment used: Rolling walker (2 wheeled) Transfers: Sit to/from Stand Sit to Stand: Min guard         General transfer comment: progress to min guarding, requires cuing for hand placement, able to safely  transfer w/ increased time to standing w/ RW  Ambulation/Gait Ambulation/Gait assistance: Min guard Ambulation Distance (Feet): 60 Feet Assistive device: Rolling walker (2 wheeled) Gait Pattern/deviations: Step-through pattern;Decreased step length - left;Decreased stance time - right;Antalgic Gait velocity: improving but still below normal for age and gender   General Gait Details: pt able to progress to step through gait pattern, still antalgic and increase pain on R stance, requires use of UE support to offload R LE in standing, is starting to bear more weight through the R LE   Stairs            Wheelchair Mobility    Modified Rankin (Stroke Patients Only)       Balance Overall balance assessment: Needs assistance Sitting-balance support: No upper extremity supported;Feet supported Sitting balance-Leahy Scale: Good Sitting balance - Comments: able to maintain upright seated posture w/o back support   Standing balance support: Bilateral upper extremity supported;During functional activity Standing balance-Leahy Scale: Fair Standing balance comment: requires use of RW for additional stability, slightly forward flexed                    Cognition Arousal/Alertness: Awake/alert Behavior During Therapy: WFL for tasks assessed/performed Overall Cognitive Status: Within Functional Limits for tasks assessed                      Exercises Total Joint Exercises Marching in Standing: AROM;Strengthening;Both;20 reps;Standing General Exercises - Lower Extremity Heel Raises: AROM;Strengthening;Both;10 reps;Standing Mini-Sqauts: AROM;Strengthening;Both;10 reps;Standing    General Comments  Pertinent Vitals/Pain Pain Score: 5  Pain Location: R hip Pain Descriptors / Indicators: Aching Pain Intervention(s): Limited activity within patient's tolerance;Monitored during session;Premedicated before session    Home Living                       Prior Function            PT Goals (current goals can now be found in the care plan section) Acute Rehab PT Goals Patient Stated Goal: To get stronger  PT Goal Formulation: With patient Time For Goal Achievement: 04/02/16 Potential to Achieve Goals: Good Progress towards PT goals: Progressing toward goals    Frequency    BID      PT Plan Current plan remains appropriate    Co-evaluation             End of Session Equipment Utilized During Treatment: Gait belt Activity Tolerance: Patient limited by pain Patient left: with call bell/phone within reach;in bed;with bed alarm set;with SCD's reapplied;Other (comment) (adductor wedge in place) Nurse Communication: Mobility status PT Visit Diagnosis: Muscle weakness (generalized) (M62.81);Unsteadiness on feet (R26.81);Pain Pain - Right/Left: Right Pain - part of body: Hip     Time: 3428-7681 PT Time Calculation (min) (ACUTE ONLY): 27 min  Charges:                       G Codes:       [employer] Student PT 03/20/2016, 3:45 PM

## 2016-03-20 NOTE — Progress Notes (Signed)
Physical Therapy Treatment Patient Details Name: Michelle Dalton MRN: [id number] DOB: [birth year] Today's Date: 03/20/2016    History of Present Illness Pt is a 56 yo female s/p R THR posterior approach, has history of bilat TKR     PT Comments    Pt stated she was feeling much better today w/o any dizziness or nausea, pain 6/10 and received pain meds prior to session. Pt demonstrated improved mobility but still requires min assistance when moving to sitting and transferring to standing w/ RW. She was able to ambulate around her room this morning and displayed decrease activity tolerance secondary to pain. She is unsteady on her feet and requires use of a RW in standing for improved stability. She was able to perform functional tasks of toileting and hygiene (handwashing and brushing her teeth) with min guarding throughout and use of a BSC. Pt is progressing but sill presents w/ decreased R hip strength and ROM as well as increased pain that limit safe functional mobility.She will continue to benefit from skilled PT to correct deficits; continue to recommend STR at this time.    Follow Up Recommendations  SNF     Equipment Recommendations       Recommendations for Other Services OT consult     Precautions / Restrictions Precautions Precautions: Posterior Hip;Fall Restrictions Weight Bearing Restrictions: Yes RLE Weight Bearing: Weight bearing as tolerated Other Position/Activity Restrictions: no hip adduction past midline, no hip IR past neutral and no hip flexion past 90 degrees    Mobility  Bed Mobility Overal bed mobility: Needs Assistance Bed Mobility: Supine to Sit     Supine to sit: Min assist;HOB elevated     General bed mobility comments: pt requires min assist to advance R LE due to increase pain and weakness, increased time for transferring to sitting w/ use of bed rails and HOB elevated  Transfers Overall transfer level: Needs assistance Equipment used: Rolling  walker (2 wheeled) Transfers: Sit to/from Stand Sit to Stand: Min assist         General transfer comment: cuing for proper hand placement and safe use of RW, pt positions R LE slightly anteriorto L, requires use of bilat UE for lift off, no buckling or LOB during transfer, requires min assist initially   Ambulation/Gait Ambulation/Gait assistance: Min guard Ambulation Distance (Feet): 15 Feet Assistive device: Rolling walker (2 wheeled) Gait Pattern/deviations: Step-to pattern;Antalgic;Trunk flexed;Decreased step length - left;Decreased stance time - right Gait velocity: decreased  Gait velocity interpretation: <1.8 ft/sec, indicative of risk for recurrent falls General Gait Details: pt ambulated w/ RW to sink and then to chair, step to pattern w/ decrease stance time on R, also utilizes bilat UE to offload R LE in stance due to pain, no bukling or LOB during ambulation   Stairs            Wheelchair Mobility    Modified Rankin (Stroke Patients Only)       Balance Overall balance assessment: Needs assistance Sitting-balance support: No upper extremity supported;Feet supported Sitting balance-Leahy Scale: Good Sitting balance - Comments: able to maintain upright seated posture w/o back support   Standing balance support: Bilateral upper extremity supported;During functional activity Standing balance-Leahy Scale: Fair Standing balance comment: requires use of RW for additional stability, slightly forward flexed, able to perform hand washing and brush her teeth standing w/ min guarding for safety                    Cognition  Arousal/Alertness: Awake/alert Behavior During Therapy: WFL for tasks assessed/performed Overall Cognitive Status: Within Functional Limits for tasks assessed                      Exercises Total Joint Exercises Ankle Circles/Pumps: AROM;Strengthening;Both;15 reps;Seated Heel Slides: AROM;Strengthening;Right;10 reps;Seated Hip  ABduction/ADduction: AROM;Both;10 reps;Strengthening;Seated Long Arc Quad: AROM;Strengthening;Right;10 reps;Seated    General Comments        Pertinent Vitals/Pain Pain Assessment: 0-10 Pain Score: 6  Pain Location: R hip Pain Descriptors / Indicators: Aching Pain Intervention(s): Monitored during session;Premedicated before session;Limited activity within patient's tolerance    Home Living                      Prior Function            PT Goals (current goals can now be found in the care plan section) Acute Rehab PT Goals Patient Stated Goal: To get stronger  PT Goal Formulation: With patient Time For Goal Achievement: 04/02/16 Potential to Achieve Goals: Good Progress towards PT goals: Progressing toward goals    Frequency    BID      PT Plan Current plan remains appropriate    Co-evaluation             End of Session Equipment Utilized During Treatment: Gait belt Activity Tolerance: Patient limited by pain Patient left: in chair;with call bell/phone within reach;with chair alarm set Nurse Communication: Mobility status PT Visit Diagnosis: Muscle weakness (generalized) (M62.81);Unsteadiness on feet (R26.81);Pain Pain - Right/Left: Right Pain - part of body: Hip     Time: 3818-4037 PT Time Calculation (min) (ACUTE ONLY): 18 min  Charges:                       G Codes:       [employer] Student PT 03/20/2016, 11:10 AM

## 2016-03-21 DIAGNOSIS — I10 Essential (primary) hypertension: Secondary | ICD-10-CM | POA: Diagnosis not present

## 2016-03-21 DIAGNOSIS — R5082 Postprocedural fever: Secondary | ICD-10-CM | POA: Diagnosis not present

## 2016-03-21 DIAGNOSIS — Z7401 Bed confinement status: Secondary | ICD-10-CM | POA: Diagnosis not present

## 2016-03-21 DIAGNOSIS — D649 Anemia, unspecified: Secondary | ICD-10-CM | POA: Diagnosis not present

## 2016-03-21 DIAGNOSIS — M1711 Unilateral primary osteoarthritis, right knee: Secondary | ICD-10-CM | POA: Diagnosis not present

## 2016-03-21 DIAGNOSIS — Z01818 Encounter for other preprocedural examination: Secondary | ICD-10-CM | POA: Diagnosis not present

## 2016-03-21 DIAGNOSIS — Z96641 Presence of right artificial hip joint: Secondary | ICD-10-CM | POA: Diagnosis not present

## 2016-03-21 DIAGNOSIS — M25559 Pain in unspecified hip: Secondary | ICD-10-CM | POA: Diagnosis not present

## 2016-03-21 DIAGNOSIS — M170 Bilateral primary osteoarthritis of knee: Secondary | ICD-10-CM | POA: Diagnosis not present

## 2016-03-21 DIAGNOSIS — M1611 Unilateral primary osteoarthritis, right hip: Secondary | ICD-10-CM | POA: Diagnosis not present

## 2016-03-21 DIAGNOSIS — M069 Rheumatoid arthritis, unspecified: Secondary | ICD-10-CM | POA: Diagnosis not present

## 2016-03-21 DIAGNOSIS — Z471 Aftercare following joint replacement surgery: Secondary | ICD-10-CM | POA: Diagnosis not present

## 2016-03-21 DIAGNOSIS — R918 Other nonspecific abnormal finding of lung field: Secondary | ICD-10-CM | POA: Diagnosis not present

## 2016-03-21 DIAGNOSIS — R7611 Nonspecific reaction to tuberculin skin test without active tuberculosis: Secondary | ICD-10-CM | POA: Diagnosis not present

## 2016-03-21 DIAGNOSIS — F329 Major depressive disorder, single episode, unspecified: Secondary | ICD-10-CM | POA: Diagnosis not present

## 2016-03-21 DIAGNOSIS — R6889 Other general symptoms and signs: Secondary | ICD-10-CM | POA: Diagnosis not present

## 2016-03-21 DIAGNOSIS — M059 Rheumatoid arthritis with rheumatoid factor, unspecified: Secondary | ICD-10-CM | POA: Diagnosis not present

## 2016-03-21 DIAGNOSIS — J45909 Unspecified asthma, uncomplicated: Secondary | ICD-10-CM | POA: Diagnosis not present

## 2016-03-21 DIAGNOSIS — Z79899 Other long term (current) drug therapy: Secondary | ICD-10-CM | POA: Diagnosis not present

## 2016-03-21 DIAGNOSIS — I517 Cardiomegaly: Secondary | ICD-10-CM | POA: Diagnosis not present

## 2016-03-21 DIAGNOSIS — F419 Anxiety disorder, unspecified: Secondary | ICD-10-CM | POA: Diagnosis not present

## 2016-03-21 LAB — BASIC METABOLIC PANEL
Anion gap: 6 (ref 5–15)
BUN: 13 mg/dL (ref 6–20)
CHLORIDE: 103 mmol/L (ref 101–111)
CO2: 29 mmol/L (ref 22–32)
CREATININE: 0.61 mg/dL (ref 0.44–1.00)
Calcium: 8.8 mg/dL — ABNORMAL LOW (ref 8.9–10.3)
GFR calc non Af Amer: >60 mL/min (ref >60)
Glucose, Bld: 102 mg/dL — ABNORMAL HIGH (ref 65–99)
POTASSIUM: 3.7 mmol/L (ref 3.5–5.1)
Sodium: 138 mmol/L (ref 135–145)

## 2016-03-21 LAB — CBC
HEMATOCRIT: 29.3 % — AB (ref 35.0–47.0)
HEMOGLOBIN: 10 g/dL — AB (ref 12.0–16.0)
MCH: 30.2 pg (ref 26.0–34.0)
MCHC: 34.2 g/dL (ref 32.0–36.0)
MCV: 88.3 fL (ref 80.0–100.0)
PLATELETS: 305 10*3/uL (ref 150–440)
RBC: 3.32 MIL/uL — AB (ref 3.80–5.20)
RDW: 15.6 % — ABNORMAL HIGH (ref 11.5–14.5)
WBC: 13.2 10*3/uL — AB (ref 3.6–11.0)

## 2016-03-21 LAB — SURGICAL PATHOLOGY

## 2016-03-21 MED ORDER — ENOXAPARIN SODIUM 40 MG/0.4ML ~~LOC~~ SOLN: 40.0000 mg | SUBCUTANEOUS | 0 refills | Status: DC

## 2016-03-21 MED ORDER — OXYCODONE HCL 5 MG PO TABS
5.0000 mg | ORAL_TABLET | ORAL | 0 refills | Status: DC | PRN
Start: 2016-03-21 — End: 2023-09-24

## 2016-03-21 NOTE — Clinical Social Work Placement (Deleted)
   CLINICAL SOCIAL WORK PLACEMENT  NOTE  Date:  03/21/2016  Patient Details  Name: Michelle Dalton MRN: [id number] Date of Birth: 21-Jan-1960  Clinical Social Work is seeking post-discharge placement for this patient at the [place] level of care (*CSW will initial, date and re-position this form in  chart as items are completed):  Yes   Patient/family provided with [place] Work Department's list of facilities offering this level of care within the geographic area requested by the patient (or if unable, by the patient's family).  Yes   Patient/family informed of their freedom to choose among providers that offer the needed level of care, that participate in Medicare, Medicaid or managed care program needed by the patient, have an available bed and are willing to accept the patient.  Yes   Patient/family informed of [place]'s ownership interest in [facility] and [facility], as well as of the fact that they are under no obligation to receive care at these facilities.  PASRR submitted to EDS on 03/19/16     PASRR number received on 03/20/16     Existing PASRR number confirmed on       FL2 transmitted to all facilities in geographic area requested by pt/family on 03/20/16     FL2 transmitted to all facilities within larger geographic area on       Patient informed that his/her managed care company has contracts with or will negotiate with certain facilities, including the following:        Yes   Patient/family informed of bed offers received.  Patient chooses bed at  [facility] )     Physician recommends and patient chooses bed at      Patient to be transferred to   on 03/14/16.  Patient to be transferred to facility by  [facility])     Patient family notified on 03/21/16 of transfer.  Name of family member notified:   (Patients sister Michelle Dalton )     PHYSICIAN       Additional Comment:     _______________________________________________ Danie Chandler, [place] Work 03/21/2016, 11:38 AM

## 2016-03-21 NOTE — Progress Notes (Signed)
Report called into [place] at [facility]. Will get patient ready for discharge at this time.

## 2016-03-21 NOTE — Discharge Summary (Signed)
Physician Discharge Summary  Patient ID: Michelle Dalton MRN: [id number] DOB/AGE: 57/14/1962 57 y.o.  Admit date: 03/19/2016 Discharge date: 03/21/2016  Admission Diagnoses:  PRIMARY OSTEOARTHRITIS OF RIGHT HIP Degenerative joint disease, right hip.  Discharge Diagnoses: Patient Active Problem List   Diagnosis Date Noted  . Status post total hip replacement, right 03/19/2016  . 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/28/2015  Degenerative joint disease, right hip.  Past Medical History:  Diagnosis Date  . Anemia   . Anxiety   . Arthritis    rheumatoid arthritis  . Asthma   . Depression   . GERD (gastroesophageal reflux disease)   . Hypertension   . Seasonal allergies      Transfusion: None   Consultants (if any):   Discharged Condition: Improved  Hospital Course: Michelle Dalton is an 56 y.o. female who was admitted 03/19/2016 with a diagnosis of degenerative joint disease of the right hip and went to the operating room on 03/19/2016 and underwent the above named procedures.    Surgeries: Procedure(s): TOTAL HIP ARTHROPLASTY on 03/19/2016 Patient tolerated the surgery well. Taken to PACU where she was stabilized and then transferred to the orthopedic floor.  Started on Lovenox 40mg  q 24 hrs. Foot pumps applied bilaterally at 80 mm. Heels elevated on bed with rolled towels. No evidence of DVT. Negative Homan. Physical therapy started on day #1 for gait training and transfer. OT started day #1 for ADL and assisted devices.  Patient's IV and Foley were d/c on POD1  Implants: Biomet press-fit system with a #14reduced proximal profile laterally offset Echo femoral stem, a 50 mm acetabular shell (secured with 2 acetabular screws) with an E-poly hi-wall liner, and a 36 mm ceramic head with a -3 mm neck adapter.  She was given perioperative antibiotics:  Anti-infectives    Start     Dose/Rate Route Frequency Ordered Stop   03/19/16 1500  ceFAZolin (ANCEF) 3 g in  dextrose 5 % 50 mL IVPB     3 g 130 mL/hr over 30 Minutes Intravenous Every 6 hours 03/19/16 1353 03/20/16 0314   03/19/16 0745  ceFAZolin (ANCEF) IVPB 2g/100 mL premix     2 g 200 mL/hr over 30 Minutes Intravenous  Once 03/19/16 0735 03/19/16 0947    .  She was given sequential compression devices, early ambulation, and Lovenox for DVT prophylaxis.  She benefited maximally from the hospital stay and there were no complications.    Recent vital signs:  Vitals:   03/21/16 0323 03/21/16 0715  BP: 137/68 132/77  Pulse: 93 98  Resp: 18 14  Temp: 99.6 F (37.6 C) 99.1 F (37.3 C)    Recent laboratory studies:  Lab Results  Component Value Date   HGB 10.0 (L) 03/21/2016   HGB 9.9 (L) 03/20/2016   HGB 12.9 03/19/2016   Lab Results  Component Value Date   WBC 13.2 (H) 03/21/2016   PLT 305 03/21/2016   Lab Results  Component Value Date   INR 0.92 03/12/2016   Lab Results  Component Value Date   NA 138 03/21/2016   K 3.7 03/21/2016   CL 103 03/21/2016   CO2 29 03/21/2016   BUN 13 03/21/2016   CREATININE 0.61 03/21/2016   GLUCOSE 102 (H) 03/21/2016    Discharge Medications:   Allergies as of 03/21/2016      Reactions   Lisinopril Swelling   Throat, lips and mouth      Medication List  TAKE these medications   amLODipine 10 MG tablet Commonly known as:  NORVASC Take 10 mg by mouth daily.   enoxaparin 40 MG/0.4ML injection Commonly known as:  LOVENOX Inject 0.4 mLs (40 mg total) into the skin daily.   Fish Oil 1000 MG Caps Take 1 capsule by mouth daily.   folic acid 1 MG tablet Commonly known as:  FOLVITE Take 1 mg by mouth daily.   hydrALAZINE 10 MG tablet Commonly known as:  APRESOLINE Take 10 mg by mouth 3 (three) times daily.   hydrochlorothiazide 25 MG tablet Commonly known as:  HYDRODIURIL Take 25 mg by mouth daily.   labetalol 100 MG tablet Commonly known as:  NORMODYNE Take 100 mg by mouth 2 (two) times daily.   loratadine 10 MG  tablet Commonly known as:  CLARITIN Take 1 tablet (10 mg total) by mouth daily. What changed:  when to take this  reasons to take this   methotrexate 2.5 MG tablet Commonly known as:  RHEUMATREX Take 15 mg by mouth once a week.   mometasone 50 MCG/ACT nasal spray Commonly known as:  NASONEX Place 2 sprays into the nose daily as needed.   montelukast 10 MG tablet Commonly known as:  SINGULAIR Take 10 mg by mouth daily.   multivitamin with minerals tablet Take 1 tablet by mouth daily.   omeprazole 20 MG capsule Commonly known as:  PRILOSEC Take 20 mg by mouth daily.   oxyCODONE 5 MG immediate release tablet Commonly known as:  Oxy IR/ROXICODONE Take 1-2 tablets (5-10 mg total) by mouth every 4 (four) hours as needed for breakthrough pain.   tiotropium 18 MCG inhalation capsule Commonly known as:  SPIRIVA Place 18 mcg into inhaler and inhale at bedtime.   VENTOLIN HFA 108 (90 Base) MCG/ACT inhaler Generic drug:  albuterol Inhale 1-2 puffs into the lungs every 4 (four) hours as needed for wheezing or shortness of breath.   albuterol (2.5 MG/3ML) 0.083% nebulizer solution Commonly known as:  PROVENTIL Inhale 2.5 mg into the lungs every 4 (four) hours as needed.       Diagnostic Studies: Dg Hip Unilat W Or W/o Pelvis 2-3 Views Right  Result Date: 03/19/2016 CLINICAL DATA:  Status post total hip joint prosthesis placement. EXAM: DG HIP (WITH OR WITHOUT PELVIS) 2-3V RIGHT COMPARISON:  None in PACs FINDINGS: The patient has undergone total right hip joint prosthesis placement. Positioning of the prosthetic components appears good. The interface with the native bone is normal. A small amount of soft tissue gas is present along the lateral aspect of the bow. IMPRESSION: There is no immediate postprocedure complication following right hip joint prosthesis placement. Electronically Signed   By: David  Martinique M.D.   On: 03/19/2016 13:12   Disposition: Plan will be for discharge  to SNF pending bowel movement today.   Contact information for follow-up providers    Judson Roch, PA-C Follow up in 14 day(s).   Specialty:  Physician Assistant Why:  Electa Sniff information: [place] [address] [phone]            Contact information for after-discharge care    Destination    [facility] Follow up.   Specialty:  [place] information: [place] [phone]                 Signed: Judson Roch PA-C 03/21/2016, 10:22 AM

## 2016-03-21 NOTE — Progress Notes (Signed)
Subjective: 2 Days Post-Op Procedure(s) (LRB): TOTAL HIP ARTHROPLASTY (Right) Patient reports pain as moderate Patient is well, and has had no acute complaints or problems Plan is to go Skilled nursing facility today pending a BM. Negative for chest pain and shortness of breath, does report increase in chronic cough Fever: 99 temp this AM. Gastrointestinal:Negative for nausea and vomiting  Objective: Vital signs in last 24 hours: Temp:  [98.2 F (36.8 C)-99.6 F (37.6 C)] 99.1 F (37.3 C) (03/15 0715) Pulse Rate:  [87-98] 98 (03/15 0715) Resp:  [14-18] 14 (03/15 0715) BP: (128-148)/(56-81) 132/77 (03/15 0715) SpO2:  [95 %-99 %] 98 % (03/15 0715)  Intake/Output from previous day:  Intake/Output Summary (Last 24 hours) at 03/21/16 1007 Last data filed at 03/21/16 0540  Gross per 24 hour  Intake          1616.67 ml  Output              600 ml  Net          1016.67 ml    Intake/Output this shift: No intake/output data recorded.  Labs:  Recent Labs  03/19/16 0800 03/20/16 0439 03/21/16 0605  HGB 12.9 9.9* 10.0*    Recent Labs  03/20/16 0439 03/21/16 0605  WBC 14.2* 13.2*  RBC 3.44* 3.32*  HCT 30.0* 29.3*  PLT 327 305    Recent Labs  03/20/16 0439 03/21/16 0605  NA 138 138  K 3.9 3.7  CL 106 103  CO2 26 29  BUN 13 13  CREATININE 0.81 0.61  GLUCOSE 147* 102*  CALCIUM 8.7* 8.8*   No results for input(s): LABPT, INR in the last 72 hours.   EXAM General - Patient is Alert, Appropriate and Oriented Extremity - ABD soft Sensation intact distally Intact pulses distally Dorsiflexion/Plantar flexion intact Incision: scant drainage No cellulitis present Dressing/Incision - Mild bloody draining to the incision, honeycomb dressing changed today. Motor Function - intact, moving foot and toes well on exam.  Abdomen soft with normal BS.  Past Medical History:  Diagnosis Date  . Anemia   . Anxiety   . Arthritis    rheumatoid arthritis  . Asthma   .  Depression   . GERD (gastroesophageal reflux disease)   . Hypertension   . Seasonal allergies     Assessment/Plan: 2 Days Post-Op Procedure(s) (LRB): TOTAL HIP ARTHROPLASTY (Right) Active Problems:   Status post total hip replacement, right  Estimated body mass index is 36.34 kg/m as calculated from the following:   Height as of this encounter: 5\' 7"  (1.702 m).   Weight as of this encounter: 105.2 kg (232 lb). Advance diet Up with therapy D/C IV fluids when tolerating po intake.  Labs reviewed, WBC decreased to 13.2, mild fever, denies any SOB or urinary symptoms.  Encouraged incentive spirometry. Stressed the importance of therapy for the patient due to contracture present during operation. Plan will be for discharge to SNF today following afternoon session with therapy and bowel movement.  DVT Prophylaxis - Lovenox, Foot Pumps and TED hose Weight-Bearing as tolerated to right leg  J. Cameron Proud, PA-C [facility] Orthopaedic Surgery 03/21/2016, 10:07 AM

## 2016-03-21 NOTE — Progress Notes (Signed)
Patient is alert and oriented and able to verbalize needs. Patient is set for discharge to [facility]. BM this shift. PIV discontinued. Vital signs stable. Discharge instructions provided to patient at this time. Patient verbalized understanding of these. EMS called and awaiting transport at this time.

## 2016-03-21 NOTE — Discharge Instructions (Signed)

## 2016-03-21 NOTE — Progress Notes (Signed)
Patient is medically stable for discharge today to [facility]. Per Magda Paganini, admissions coordinator at [facility], patient can come to room 402. Clinical Education officer, museum (CSW) sent discharge orders in [place]. Social work Theatre manager met with patient at bedside and explained that patient will discharge today to [facility]. Social work Theatre manager also explained that [employer] authorization is still pending and a 5 day LOG is granted. Social work Theatre manager explained that if [employer] authorization is not received within 5 days patient will have to discharge from [facility] and go home. Patient verbally agreed she understood the above. Social work Theatre manager attempted to leave patient's sister, Davy Pique, a voicemail explaining patient's discharge today. Patient's sisters mailbox was full. RN will call report and arrange EMS for transport. Please re-consult if future social work needs arise. Social work Architectural technologist off.   Danie Chandler, Social Work Intern  [phone]

## 2016-03-21 NOTE — Clinical Social Work Placement (Signed)
   CLINICAL SOCIAL WORK PLACEMENT  NOTE  Date:  03/21/2016  Patient Details  Name: Michelle Dalton MRN: [id number] Date of Birth: 09/08/1960  Clinical Social Work is seeking post-discharge placement for this patient at the [place] level of care (*CSW will initial, date and re-position this form in  chart as items are completed):  Yes   Patient/family provided with [facility] Work Department's list of facilities offering this level of care within the geographic area requested by the patient (or if unable, by the patient's family).  Yes   Patient/family informed of their freedom to choose among providers that offer the needed level of care, that participate in Medicare, Medicaid or managed care program needed by the patient, have an available bed and are willing to accept the patient.  Yes   Patient/family informed of [facility]'s ownership interest in [facility] and [facility], as well as of the fact that they are under no obligation to receive care at these facilities.  PASRR submitted to EDS on 03/19/16     PASRR number received on 03/20/16     Existing PASRR number confirmed on       FL2 transmitted to all facilities in geographic area requested by pt/family on 03/20/16     FL2 transmitted to all facilities within larger geographic area on       Patient informed that his/her managed care company has contracts with or will negotiate with certain facilities, including the following:        Yes   Patient/family informed of bed offers received.  Patient chooses bed at  [facility] )     Physician recommends and patient chooses bed at      Patient to be transferred to   on 03/14/16.  Patient to be transferred to facility by  [facility])     Patient family notified on 03/21/16 of transfer.  Name of family member notified:   (Attempted to contact patient's sister [place] )     PHYSICIAN       Additional Comment:     _______________________________________________ Danie Chandler, [place] Work 03/21/2016, 11:55 AM

## 2016-03-21 NOTE — Progress Notes (Signed)
Patient has not had a BM since surgery. Active bowel sounds and passing flatus. Prune juice and milk of magnesia given to patient. Will continue to monitor.

## 2016-03-21 NOTE — Progress Notes (Signed)
Physical Therapy Treatment Patient Details Name: Michelle Dalton MRN: [id number] DOB: 1960-01-29 Today's Date: 03/21/2016    History of Present Illness Pt is a 56 yo female s/p R THR posterior approach, has history of bilat TKR     PT Comments    Pt displayed increased pain of the R hip and knee stated she just feels tight, but still demonstrated good participation throughout PT treatment. Pt required increase time and min assist for bed mobility and min guarding for transfers secondary to pain and R hip weakness. She ambulated w/ Rw and min guarding; demonstrated decreased gait speed this morning and only tolerated 20' due to increased tightness and pain throughout her R hip and thigh. Pt able to transfer to [facility] w/ min guarding and independently perform toileting. Overall patient is progressing but still limited in safe functional mobility secondary to decreased R hip strength, and ROM as well as increased pain. She will continue to benefit from skilled PT to correct deficits. Recommend STR following hospital stay.     Follow Up Recommendations  SNF     Equipment Recommendations       Recommendations for Other Services       Precautions / Restrictions Precautions Precautions: Posterior Hip;Fall Precaution Booklet Issued: No Restrictions Weight Bearing Restrictions: Yes RLE Weight Bearing: Weight bearing as tolerated Other Position/Activity Restrictions: no hip adduction past midline, no hip IR past neutral and no hip flexion past 90 degrees    Mobility  Bed Mobility Overal bed mobility: Needs Assistance Bed Mobility: Sit to Supine     Supine to sit: Min assist;HOB elevated     General bed mobility comments: requires min assist to advance R LE into bed   Transfers Overall transfer level: Needs assistance Equipment used: Rolling walker (2 wheeled) Transfers: Sit to/from Stand Sit to Stand: Min guard         General transfer comment: min guarding for safety requires  use of B Hands, demonstrates good safe technique, increased pain w/ transfers today  Ambulation/Gait Ambulation/Gait assistance: Min guard Ambulation Distance (Feet): 20 Feet Assistive device: Rolling walker (2 wheeled) Gait Pattern/deviations: Step-to pattern;Decreased step length - left;Decreased stance time - right;Antalgic Gait velocity: decreased secondary to pain    General Gait Details: pt limited by increased pain and R LE tightness only ambulated to outside of room and back, heavy use of b UEs to offload R LE in R stance phase, decreased step length and gati speed   Stairs            Wheelchair Mobility    Modified Rankin (Stroke Patients Only)       Balance Overall balance assessment: Needs assistance Sitting-balance support: No upper extremity supported;Feet supported Sitting balance-Leahy Scale: Good Sitting balance - Comments: able to maintain upright seated posture w/o back support   Standing balance support: Bilateral upper extremity supported;During functional activity Standing balance-Leahy Scale: Fair Standing balance comment: requires use of RW for additional stability, slightly forward flexed, R knee remained somwhat flexed throughout standing                     Cognition Arousal/Alertness: Awake/alert Behavior During Therapy: WFL for tasks assessed/performed Overall Cognitive Status: Within Functional Limits for tasks assessed                      Exercises Total Joint Exercises Ankle Circles/Pumps: AROM;Strengthening;Both;15 reps Quad Sets: AROM;Strengthening;Right;15 reps Gluteal Sets: AROM;Strengthening;Both;15 reps Heel Slides: AROM;Right;10 reps;Supine Hip ABduction/ADduction:  AROM;Strengthening;Right;Supine;10 reps Straight Leg Raises: AROM;Strengthening;10 reps;Supine;Right    General Comments        Pertinent Vitals/Pain Pain Assessment: 0-10 Pain Score: 4  Pain Location: R hip Pain Descriptors / Indicators:  Aching;Tightness Pain Intervention(s): Limited activity within patient's tolerance;Monitored during session;Premedicated before session    Home Living                      Prior Function            PT Goals (current goals can now be found in the care plan section) Acute Rehab PT Goals Patient Stated Goal: To get stronger  PT Goal Formulation: With patient Time For Goal Achievement: 04/02/16 Potential to Achieve Goals: Good Progress towards PT goals: Progressing toward goals    Frequency    BID      PT Plan Current plan remains appropriate    Co-evaluation             End of Session Equipment Utilized During Treatment: Oxygen Activity Tolerance: Patient limited by pain Patient left: in chair;with chair alarm set;with call bell/phone within reach Nurse Communication: Mobility status PT Visit Diagnosis: Muscle weakness (generalized) (M62.81);Unsteadiness on feet (R26.81);Pain Pain - Right/Left: Right Pain - part of body: Hip     Time: 7290-2111 PT Time Calculation (min) (ACUTE ONLY): 26 min  Charges:                       G Codes:       Avry Monteleone 03/27/2016, 11:19 AM

## 2016-03-22 DIAGNOSIS — M059 Rheumatoid arthritis with rheumatoid factor, unspecified: Secondary | ICD-10-CM | POA: Diagnosis not present

## 2016-03-22 DIAGNOSIS — R5082 Postprocedural fever: Secondary | ICD-10-CM | POA: Diagnosis not present

## 2016-03-25 ENCOUNTER — Other Ambulatory Visit: Payer: Self-pay | Admitting: Nurse Practitioner

## 2016-03-25 ENCOUNTER — Ambulatory Visit
Admission: RE | Admit: 2016-03-25 | Discharge: 2016-03-25 | Disposition: A | Discharge status: Home / Self Care | Payer: Medicare HMO | Source: Ambulatory Visit | Attending: Internal Medicine | Admitting: Internal Medicine

## 2016-03-25 ENCOUNTER — Other Ambulatory Visit: Payer: Self-pay | Admitting: Internal Medicine

## 2016-03-25 ENCOUNTER — Ambulatory Visit
Admission: RE | Admit: 2016-03-25 | Discharge: 2016-03-25 | Disposition: A | Discharge status: Home / Self Care | Payer: Medicare HMO | Source: Ambulatory Visit | Attending: Nurse Practitioner | Admitting: Nurse Practitioner

## 2016-03-25 DIAGNOSIS — R918 Other nonspecific abnormal finding of lung field: Secondary | ICD-10-CM | POA: Diagnosis not present

## 2016-03-25 DIAGNOSIS — I517 Cardiomegaly: Secondary | ICD-10-CM | POA: Diagnosis not present

## 2016-03-25 DIAGNOSIS — R7611 Nonspecific reaction to tuberculin skin test without active tuberculosis: Secondary | ICD-10-CM

## 2016-03-25 DIAGNOSIS — M1711 Unilateral primary osteoarthritis, right knee: Secondary | ICD-10-CM | POA: Diagnosis not present

## 2016-03-25 DIAGNOSIS — Z01818 Encounter for other preprocedural examination: Secondary | ICD-10-CM | POA: Insufficient documentation

## 2016-03-27 NOTE — Progress Notes (Signed)
Pt discharged to [facility] on 03/12/2016. Medication reconciliation completed by PharmD with [facility] on 03/26/2016. No issues noted.   Angela Burke, PharmD, BCPS Pharmacy Resident Pager: [phone]

## 2016-04-03 DIAGNOSIS — Z79899 Other long term (current) drug therapy: Secondary | ICD-10-CM | POA: Diagnosis not present

## 2016-04-03 DIAGNOSIS — M170 Bilateral primary osteoarthritis of knee: Secondary | ICD-10-CM | POA: Diagnosis not present

## 2016-04-03 DIAGNOSIS — M059 Rheumatoid arthritis with rheumatoid factor, unspecified: Secondary | ICD-10-CM | POA: Diagnosis not present

## 2016-04-03 DIAGNOSIS — M1611 Unilateral primary osteoarthritis, right hip: Secondary | ICD-10-CM | POA: Diagnosis not present

## 2016-04-08 ENCOUNTER — Other Ambulatory Visit: Payer: Self-pay | Admitting: *Deleted

## 2016-04-08 NOTE — Patient Outreach (Signed)
Spoke with Drue Novel, SW at facility. She reports patient discharged home 04/05/16.  She set up home care with [facility].  Plan to sign off as patient has discharged from facility.   Royetta Crochet. Laymond Purser, RN, BSN, [place] [phone]) Business Cell  [phone]) Toll Free Office

## 2016-04-09 DIAGNOSIS — I10 Essential (primary) hypertension: Secondary | ICD-10-CM | POA: Diagnosis not present

## 2016-04-09 DIAGNOSIS — M069 Rheumatoid arthritis, unspecified: Secondary | ICD-10-CM | POA: Diagnosis not present

## 2016-04-09 DIAGNOSIS — D649 Anemia, unspecified: Secondary | ICD-10-CM | POA: Diagnosis not present

## 2016-04-09 DIAGNOSIS — J45909 Unspecified asthma, uncomplicated: Secondary | ICD-10-CM | POA: Diagnosis not present

## 2016-04-09 DIAGNOSIS — Z471 Aftercare following joint replacement surgery: Secondary | ICD-10-CM | POA: Diagnosis not present

## 2016-04-09 DIAGNOSIS — K219 Gastro-esophageal reflux disease without esophagitis: Secondary | ICD-10-CM | POA: Diagnosis not present

## 2016-04-09 DIAGNOSIS — Z96641 Presence of right artificial hip joint: Secondary | ICD-10-CM | POA: Diagnosis not present

## 2016-04-09 DIAGNOSIS — F419 Anxiety disorder, unspecified: Secondary | ICD-10-CM | POA: Diagnosis not present

## 2016-04-09 DIAGNOSIS — F329 Major depressive disorder, single episode, unspecified: Secondary | ICD-10-CM | POA: Diagnosis not present

## 2016-04-12 DIAGNOSIS — M069 Rheumatoid arthritis, unspecified: Secondary | ICD-10-CM | POA: Diagnosis not present

## 2016-04-12 DIAGNOSIS — D649 Anemia, unspecified: Secondary | ICD-10-CM | POA: Diagnosis not present

## 2016-04-12 DIAGNOSIS — F329 Major depressive disorder, single episode, unspecified: Secondary | ICD-10-CM | POA: Diagnosis not present

## 2016-04-12 DIAGNOSIS — I10 Essential (primary) hypertension: Secondary | ICD-10-CM | POA: Diagnosis not present

## 2016-04-12 DIAGNOSIS — J45909 Unspecified asthma, uncomplicated: Secondary | ICD-10-CM | POA: Diagnosis not present

## 2016-04-12 DIAGNOSIS — Z471 Aftercare following joint replacement surgery: Secondary | ICD-10-CM | POA: Diagnosis not present

## 2016-04-12 DIAGNOSIS — K219 Gastro-esophageal reflux disease without esophagitis: Secondary | ICD-10-CM | POA: Diagnosis not present

## 2016-04-12 DIAGNOSIS — Z96641 Presence of right artificial hip joint: Secondary | ICD-10-CM | POA: Diagnosis not present

## 2016-04-12 DIAGNOSIS — F419 Anxiety disorder, unspecified: Secondary | ICD-10-CM | POA: Diagnosis not present

## 2016-04-16 DIAGNOSIS — Z471 Aftercare following joint replacement surgery: Secondary | ICD-10-CM | POA: Diagnosis not present

## 2016-04-16 DIAGNOSIS — K219 Gastro-esophageal reflux disease without esophagitis: Secondary | ICD-10-CM | POA: Diagnosis not present

## 2016-04-16 DIAGNOSIS — F329 Major depressive disorder, single episode, unspecified: Secondary | ICD-10-CM | POA: Diagnosis not present

## 2016-04-16 DIAGNOSIS — M069 Rheumatoid arthritis, unspecified: Secondary | ICD-10-CM | POA: Diagnosis not present

## 2016-04-16 DIAGNOSIS — Z96641 Presence of right artificial hip joint: Secondary | ICD-10-CM | POA: Diagnosis not present

## 2016-04-16 DIAGNOSIS — D649 Anemia, unspecified: Secondary | ICD-10-CM | POA: Diagnosis not present

## 2016-04-16 DIAGNOSIS — I10 Essential (primary) hypertension: Secondary | ICD-10-CM | POA: Diagnosis not present

## 2016-04-16 DIAGNOSIS — J45909 Unspecified asthma, uncomplicated: Secondary | ICD-10-CM | POA: Diagnosis not present

## 2016-04-16 DIAGNOSIS — F419 Anxiety disorder, unspecified: Secondary | ICD-10-CM | POA: Diagnosis not present

## 2016-04-18 DIAGNOSIS — K219 Gastro-esophageal reflux disease without esophagitis: Secondary | ICD-10-CM | POA: Diagnosis not present

## 2016-04-18 DIAGNOSIS — M069 Rheumatoid arthritis, unspecified: Secondary | ICD-10-CM | POA: Diagnosis not present

## 2016-04-18 DIAGNOSIS — F419 Anxiety disorder, unspecified: Secondary | ICD-10-CM | POA: Diagnosis not present

## 2016-04-18 DIAGNOSIS — Z471 Aftercare following joint replacement surgery: Secondary | ICD-10-CM | POA: Diagnosis not present

## 2016-04-18 DIAGNOSIS — F329 Major depressive disorder, single episode, unspecified: Secondary | ICD-10-CM | POA: Diagnosis not present

## 2016-04-18 DIAGNOSIS — J45909 Unspecified asthma, uncomplicated: Secondary | ICD-10-CM | POA: Diagnosis not present

## 2016-04-18 DIAGNOSIS — I10 Essential (primary) hypertension: Secondary | ICD-10-CM | POA: Diagnosis not present

## 2016-04-18 DIAGNOSIS — D649 Anemia, unspecified: Secondary | ICD-10-CM | POA: Diagnosis not present

## 2016-04-18 DIAGNOSIS — Z96641 Presence of right artificial hip joint: Secondary | ICD-10-CM | POA: Diagnosis not present

## 2016-04-25 ENCOUNTER — Ambulatory Visit: Admission: RE | Admit: 2016-04-25 | Payer: Medicare HMO | Source: Ambulatory Visit

## 2016-04-25 ENCOUNTER — Other Ambulatory Visit: Payer: Self-pay | Admitting: Orthopedic Surgery

## 2016-04-25 DIAGNOSIS — Z471 Aftercare following joint replacement surgery: Secondary | ICD-10-CM | POA: Diagnosis not present

## 2016-04-25 DIAGNOSIS — M7989 Other specified soft tissue disorders: Principal | ICD-10-CM

## 2016-04-25 DIAGNOSIS — D649 Anemia, unspecified: Secondary | ICD-10-CM | POA: Diagnosis not present

## 2016-04-25 DIAGNOSIS — Z96641 Presence of right artificial hip joint: Secondary | ICD-10-CM | POA: Diagnosis not present

## 2016-04-25 DIAGNOSIS — J45909 Unspecified asthma, uncomplicated: Secondary | ICD-10-CM | POA: Diagnosis not present

## 2016-04-25 DIAGNOSIS — F329 Major depressive disorder, single episode, unspecified: Secondary | ICD-10-CM | POA: Diagnosis not present

## 2016-04-25 DIAGNOSIS — M79661 Pain in right lower leg: Secondary | ICD-10-CM

## 2016-04-25 DIAGNOSIS — M069 Rheumatoid arthritis, unspecified: Secondary | ICD-10-CM | POA: Diagnosis not present

## 2016-04-25 DIAGNOSIS — K219 Gastro-esophageal reflux disease without esophagitis: Secondary | ICD-10-CM | POA: Diagnosis not present

## 2016-04-25 DIAGNOSIS — I10 Essential (primary) hypertension: Secondary | ICD-10-CM | POA: Diagnosis not present

## 2016-04-25 DIAGNOSIS — F419 Anxiety disorder, unspecified: Secondary | ICD-10-CM | POA: Diagnosis not present

## 2016-04-26 DIAGNOSIS — Z96641 Presence of right artificial hip joint: Secondary | ICD-10-CM | POA: Diagnosis not present

## 2016-04-26 DIAGNOSIS — F329 Major depressive disorder, single episode, unspecified: Secondary | ICD-10-CM | POA: Diagnosis not present

## 2016-04-26 DIAGNOSIS — J45909 Unspecified asthma, uncomplicated: Secondary | ICD-10-CM | POA: Diagnosis not present

## 2016-04-26 DIAGNOSIS — Z471 Aftercare following joint replacement surgery: Secondary | ICD-10-CM | POA: Diagnosis not present

## 2016-04-26 DIAGNOSIS — I10 Essential (primary) hypertension: Secondary | ICD-10-CM | POA: Diagnosis not present

## 2016-04-26 DIAGNOSIS — F419 Anxiety disorder, unspecified: Secondary | ICD-10-CM | POA: Diagnosis not present

## 2016-04-26 DIAGNOSIS — D649 Anemia, unspecified: Secondary | ICD-10-CM | POA: Diagnosis not present

## 2016-04-26 DIAGNOSIS — M069 Rheumatoid arthritis, unspecified: Secondary | ICD-10-CM | POA: Diagnosis not present

## 2016-04-26 DIAGNOSIS — K219 Gastro-esophageal reflux disease without esophagitis: Secondary | ICD-10-CM | POA: Diagnosis not present

## 2016-04-29 ENCOUNTER — Ambulatory Visit
Admission: RE | Admit: 2016-04-29 | Discharge: 2016-04-29 | Disposition: A | Discharge status: Home / Self Care | Payer: Medicare HMO | Source: Ambulatory Visit | Attending: Orthopedic Surgery | Admitting: Orthopedic Surgery

## 2016-04-29 ENCOUNTER — Other Ambulatory Visit
Admission: RE | Admit: 2016-04-29 | Discharge: 2016-04-29 | Disposition: A | Discharge status: Home / Self Care | Payer: Medicare HMO | Source: Ambulatory Visit | Attending: Surgery | Admitting: Surgery

## 2016-04-29 DIAGNOSIS — Z79899 Other long term (current) drug therapy: Secondary | ICD-10-CM | POA: Diagnosis not present

## 2016-04-29 DIAGNOSIS — M79661 Pain in right lower leg: Secondary | ICD-10-CM

## 2016-04-29 DIAGNOSIS — R2241 Localized swelling, mass and lump, right lower limb: Secondary | ICD-10-CM | POA: Diagnosis not present

## 2016-04-29 DIAGNOSIS — M7989 Other specified soft tissue disorders: Secondary | ICD-10-CM | POA: Insufficient documentation

## 2016-04-29 DIAGNOSIS — Z96641 Presence of right artificial hip joint: Secondary | ICD-10-CM | POA: Diagnosis not present

## 2016-04-29 LAB — FIBRIN DERIVATIVES D-DIMER (ARMC ONLY): Fibrin derivatives D-dimer (ARMC): 1241.07 — ABNORMAL HIGH (ref 0.00–499.00)

## 2016-05-01 ENCOUNTER — Other Ambulatory Visit: Payer: Self-pay | Admitting: Surgery

## 2016-05-02 ENCOUNTER — Other Ambulatory Visit: Payer: Self-pay | Admitting: Surgery

## 2016-05-02 DIAGNOSIS — Z96641 Presence of right artificial hip joint: Secondary | ICD-10-CM

## 2016-05-03 ENCOUNTER — Other Ambulatory Visit: Payer: Self-pay | Admitting: Surgery

## 2016-05-03 ENCOUNTER — Ambulatory Visit: Admission: RE | Admit: 2016-05-03 | Payer: Medicare HMO | Source: Ambulatory Visit

## 2016-05-03 DIAGNOSIS — Z96641 Presence of right artificial hip joint: Secondary | ICD-10-CM

## 2016-05-07 ENCOUNTER — Ambulatory Visit: Payer: Medicare HMO

## 2016-05-08 ENCOUNTER — Ambulatory Visit
Admission: RE | Admit: 2016-05-08 | Discharge: 2016-05-08 | Disposition: A | Discharge status: Home / Self Care | Payer: Medicare HMO | Source: Ambulatory Visit | Attending: Surgery | Admitting: Surgery

## 2016-05-08 DIAGNOSIS — Z96641 Presence of right artificial hip joint: Secondary | ICD-10-CM

## 2016-05-10 ENCOUNTER — Ambulatory Visit
Admission: RE | Admit: 2016-05-10 | Discharge: 2016-05-10 | Disposition: A | Discharge status: Home / Self Care | Payer: Medicare HMO | Source: Ambulatory Visit | Attending: Surgery | Admitting: Surgery

## 2016-05-10 DIAGNOSIS — M25551 Pain in right hip: Secondary | ICD-10-CM | POA: Diagnosis not present

## 2016-05-10 DIAGNOSIS — Z96641 Presence of right artificial hip joint: Secondary | ICD-10-CM | POA: Insufficient documentation

## 2016-05-10 LAB — SYNOVIAL CELL COUNT + DIFF, W/ CRYSTALS
Crystals, Fluid: NONE SEEN
EOSINOPHILS-SYNOVIAL: 1 %
Lymphocytes-Synovial Fld: 62 %
MONOCYTE-MACROPHAGE-SYNOVIAL FLUID: 8 %
Neutrophil, Synovial: 24 %
OTHER CELLS-SYN: 5
WBC, Synovial: 32 /mm3 (ref 0–200)

## 2016-05-10 MED ORDER — SODIUM CHLORIDE 0.9 % IJ SOLN
10.0000 mL | Freq: Once | INTRAMUSCULAR | Status: AC
  Administered 2016-05-10: 10 mL

## 2016-05-10 MED ORDER — IOPAMIDOL (ISOVUE-200) INJECTION 41%
50.0000 mL | Freq: Once | INTRAVENOUS | Status: AC
  Administered 2016-05-10: 50 mL via INTRA_ARTICULAR
  Filled 2016-05-10: qty 50

## 2016-05-10 NOTE — OR Nursing (Signed)
Dr register oked pt to get one set of vital signs and discharge

## 2016-05-10 NOTE — Discharge Instructions (Signed)
Observe injection site right hip for signs of infection. Take acetaminophen for discomfort as needed as directed on bottle. Go to emergency room in event of life threatening symptoms and tell them you had a hip aspiration today. Notify your primary physician if you have any questions or concerns.

## 2016-05-14 DIAGNOSIS — I10 Essential (primary) hypertension: Secondary | ICD-10-CM | POA: Diagnosis not present

## 2016-05-14 DIAGNOSIS — K219 Gastro-esophageal reflux disease without esophagitis: Secondary | ICD-10-CM | POA: Diagnosis not present

## 2016-05-14 DIAGNOSIS — M069 Rheumatoid arthritis, unspecified: Secondary | ICD-10-CM | POA: Diagnosis not present

## 2016-05-14 DIAGNOSIS — Z471 Aftercare following joint replacement surgery: Secondary | ICD-10-CM | POA: Diagnosis not present

## 2016-05-14 DIAGNOSIS — F329 Major depressive disorder, single episode, unspecified: Secondary | ICD-10-CM | POA: Diagnosis not present

## 2016-05-14 DIAGNOSIS — D649 Anemia, unspecified: Secondary | ICD-10-CM | POA: Diagnosis not present

## 2016-05-14 DIAGNOSIS — F419 Anxiety disorder, unspecified: Secondary | ICD-10-CM | POA: Diagnosis not present

## 2016-05-14 DIAGNOSIS — J45909 Unspecified asthma, uncomplicated: Secondary | ICD-10-CM | POA: Diagnosis not present

## 2016-05-14 DIAGNOSIS — Z96641 Presence of right artificial hip joint: Secondary | ICD-10-CM | POA: Diagnosis not present

## 2016-05-14 LAB — BODY FLUID CULTURE
CULTURE: NO GROWTH
Gram Stain: NONE SEEN

## 2016-05-22 DIAGNOSIS — M059 Rheumatoid arthritis with rheumatoid factor, unspecified: Secondary | ICD-10-CM | POA: Diagnosis not present

## 2016-05-22 DIAGNOSIS — J310 Chronic rhinitis: Secondary | ICD-10-CM | POA: Diagnosis not present

## 2016-05-22 DIAGNOSIS — Z79899 Other long term (current) drug therapy: Secondary | ICD-10-CM | POA: Diagnosis not present

## 2016-05-22 DIAGNOSIS — J4530 Mild persistent asthma, uncomplicated: Secondary | ICD-10-CM | POA: Diagnosis not present

## 2016-05-31 DIAGNOSIS — M47816 Spondylosis without myelopathy or radiculopathy, lumbar region: Secondary | ICD-10-CM | POA: Diagnosis not present

## 2016-05-31 DIAGNOSIS — Z96641 Presence of right artificial hip joint: Secondary | ICD-10-CM | POA: Diagnosis not present

## 2016-05-31 DIAGNOSIS — M059 Rheumatoid arthritis with rheumatoid factor, unspecified: Secondary | ICD-10-CM | POA: Diagnosis not present

## 2016-05-31 DIAGNOSIS — M5441 Lumbago with sciatica, right side: Secondary | ICD-10-CM | POA: Diagnosis not present

## 2016-05-31 LAB — CULTURE, FUNGUS WITHOUT SMEAR

## 2016-07-04 DIAGNOSIS — M059 Rheumatoid arthritis with rheumatoid factor, unspecified: Secondary | ICD-10-CM | POA: Diagnosis not present

## 2016-07-04 DIAGNOSIS — M170 Bilateral primary osteoarthritis of knee: Secondary | ICD-10-CM | POA: Diagnosis not present

## 2016-07-04 DIAGNOSIS — M25512 Pain in left shoulder: Secondary | ICD-10-CM | POA: Diagnosis not present

## 2016-07-04 DIAGNOSIS — Z96641 Presence of right artificial hip joint: Secondary | ICD-10-CM | POA: Diagnosis not present

## 2016-07-04 DIAGNOSIS — Z96651 Presence of right artificial knee joint: Secondary | ICD-10-CM | POA: Diagnosis not present

## 2016-07-04 DIAGNOSIS — Z79899 Other long term (current) drug therapy: Secondary | ICD-10-CM | POA: Diagnosis not present

## 2016-07-04 DIAGNOSIS — M1611 Unilateral primary osteoarthritis, right hip: Secondary | ICD-10-CM | POA: Diagnosis not present

## 2016-07-04 DIAGNOSIS — G8929 Other chronic pain: Secondary | ICD-10-CM | POA: Diagnosis not present

## 2016-07-26 DIAGNOSIS — E669 Obesity, unspecified: Secondary | ICD-10-CM | POA: Diagnosis not present

## 2016-07-26 DIAGNOSIS — I10 Essential (primary) hypertension: Secondary | ICD-10-CM | POA: Diagnosis not present

## 2016-07-26 DIAGNOSIS — Z0000 Encounter for general adult medical examination without abnormal findings: Secondary | ICD-10-CM | POA: Diagnosis not present

## 2016-07-29 ENCOUNTER — Other Ambulatory Visit: Payer: Self-pay | Admitting: Family Medicine

## 2016-07-29 DIAGNOSIS — N644 Mastodynia: Secondary | ICD-10-CM

## 2016-08-05 ENCOUNTER — Other Ambulatory Visit: Payer: Medicare HMO

## 2016-08-12 ENCOUNTER — Other Ambulatory Visit: Payer: Medicare HMO

## 2016-08-22 DIAGNOSIS — M059 Rheumatoid arthritis with rheumatoid factor, unspecified: Secondary | ICD-10-CM | POA: Diagnosis not present

## 2016-08-22 DIAGNOSIS — Z79899 Other long term (current) drug therapy: Secondary | ICD-10-CM | POA: Diagnosis not present

## 2016-09-10 ENCOUNTER — Other Ambulatory Visit: Payer: Self-pay | Admitting: Family Medicine

## 2016-09-10 DIAGNOSIS — Z1231 Encounter for screening mammogram for malignant neoplasm of breast: Secondary | ICD-10-CM

## 2016-09-25 DIAGNOSIS — Z0000 Encounter for general adult medical examination without abnormal findings: Secondary | ICD-10-CM | POA: Diagnosis not present

## 2016-10-10 DIAGNOSIS — M059 Rheumatoid arthritis with rheumatoid factor, unspecified: Secondary | ICD-10-CM | POA: Diagnosis not present

## 2016-10-10 DIAGNOSIS — Z96651 Presence of right artificial knee joint: Secondary | ICD-10-CM | POA: Diagnosis not present

## 2016-10-10 DIAGNOSIS — Z96641 Presence of right artificial hip joint: Secondary | ICD-10-CM | POA: Diagnosis not present

## 2016-10-10 DIAGNOSIS — M7552 Bursitis of left shoulder: Secondary | ICD-10-CM | POA: Diagnosis not present

## 2016-10-10 DIAGNOSIS — E669 Obesity, unspecified: Secondary | ICD-10-CM | POA: Diagnosis not present

## 2016-10-10 DIAGNOSIS — M7918 Myalgia, other site: Secondary | ICD-10-CM | POA: Diagnosis not present

## 2016-10-10 DIAGNOSIS — Z79899 Other long term (current) drug therapy: Secondary | ICD-10-CM | POA: Diagnosis not present

## 2016-10-10 DIAGNOSIS — M170 Bilateral primary osteoarthritis of knee: Secondary | ICD-10-CM | POA: Diagnosis not present

## 2016-10-11 DIAGNOSIS — Z471 Aftercare following joint replacement surgery: Secondary | ICD-10-CM | POA: Diagnosis not present

## 2016-10-22 ENCOUNTER — Ambulatory Visit
Admission: RE | Admit: 2016-10-22 | Discharge: 2016-10-22 | Disposition: A | Discharge status: Home / Self Care | Payer: Medicare HMO | Source: Ambulatory Visit | Attending: Family Medicine | Admitting: Family Medicine

## 2016-10-22 DIAGNOSIS — Z1231 Encounter for screening mammogram for malignant neoplasm of breast: Secondary | ICD-10-CM | POA: Diagnosis not present

## 2016-10-22 DIAGNOSIS — R928 Other abnormal and inconclusive findings on diagnostic imaging of breast: Secondary | ICD-10-CM | POA: Diagnosis not present

## 2016-10-22 DIAGNOSIS — N6320 Unspecified lump in the left breast, unspecified quadrant: Secondary | ICD-10-CM | POA: Insufficient documentation

## 2016-10-23 ENCOUNTER — Other Ambulatory Visit: Payer: Self-pay | Admitting: Family Medicine

## 2016-10-23 DIAGNOSIS — N6320 Unspecified lump in the left breast, unspecified quadrant: Secondary | ICD-10-CM

## 2016-10-23 DIAGNOSIS — R928 Other abnormal and inconclusive findings on diagnostic imaging of breast: Secondary | ICD-10-CM

## 2016-11-01 ENCOUNTER — Ambulatory Visit
Admission: RE | Admit: 2016-11-01 | Discharge: 2016-11-01 | Disposition: A | Discharge status: Home / Self Care | Payer: Medicare HMO | Source: Ambulatory Visit | Attending: Family Medicine | Admitting: Family Medicine

## 2016-11-01 DIAGNOSIS — N6322 Unspecified lump in the left breast, upper inner quadrant: Secondary | ICD-10-CM | POA: Insufficient documentation

## 2016-11-01 DIAGNOSIS — N6320 Unspecified lump in the left breast, unspecified quadrant: Secondary | ICD-10-CM | POA: Diagnosis present

## 2016-11-01 DIAGNOSIS — R928 Other abnormal and inconclusive findings on diagnostic imaging of breast: Secondary | ICD-10-CM

## 2016-11-01 DIAGNOSIS — N6489 Other specified disorders of breast: Secondary | ICD-10-CM | POA: Diagnosis not present

## 2016-11-04 ENCOUNTER — Other Ambulatory Visit: Payer: Self-pay | Admitting: Family Medicine

## 2016-11-04 DIAGNOSIS — H903 Sensorineural hearing loss, bilateral: Secondary | ICD-10-CM | POA: Diagnosis not present

## 2016-11-04 DIAGNOSIS — H93291 Other abnormal auditory perceptions, right ear: Secondary | ICD-10-CM | POA: Diagnosis not present

## 2016-11-04 DIAGNOSIS — H60549 Acute eczematoid otitis externa, unspecified ear: Secondary | ICD-10-CM | POA: Diagnosis not present

## 2016-11-04 DIAGNOSIS — N6320 Unspecified lump in the left breast, unspecified quadrant: Secondary | ICD-10-CM

## 2016-11-11 DIAGNOSIS — R300 Dysuria: Secondary | ICD-10-CM | POA: Diagnosis not present

## 2016-11-19 DIAGNOSIS — K219 Gastro-esophageal reflux disease without esophagitis: Secondary | ICD-10-CM | POA: Diagnosis not present

## 2016-11-19 DIAGNOSIS — J449 Chronic obstructive pulmonary disease, unspecified: Secondary | ICD-10-CM | POA: Diagnosis not present

## 2016-11-19 DIAGNOSIS — G479 Sleep disorder, unspecified: Secondary | ICD-10-CM | POA: Diagnosis not present

## 2016-11-19 DIAGNOSIS — R0602 Shortness of breath: Secondary | ICD-10-CM | POA: Diagnosis not present

## 2016-11-21 IMAGING — CR DG CHEST 2V
1 series · 2 of 2 positions shown · non-contrast
Comparison: 03/02/2015

CLINICAL DATA: SOB X 3 days. Hx of asthma. Nonsmoker.

EXAM:
CHEST  2 VIEW

[Series 1: dg chest 2 view · 0.14mm/px · 2 of 2 slices shown]
[im 1/2]
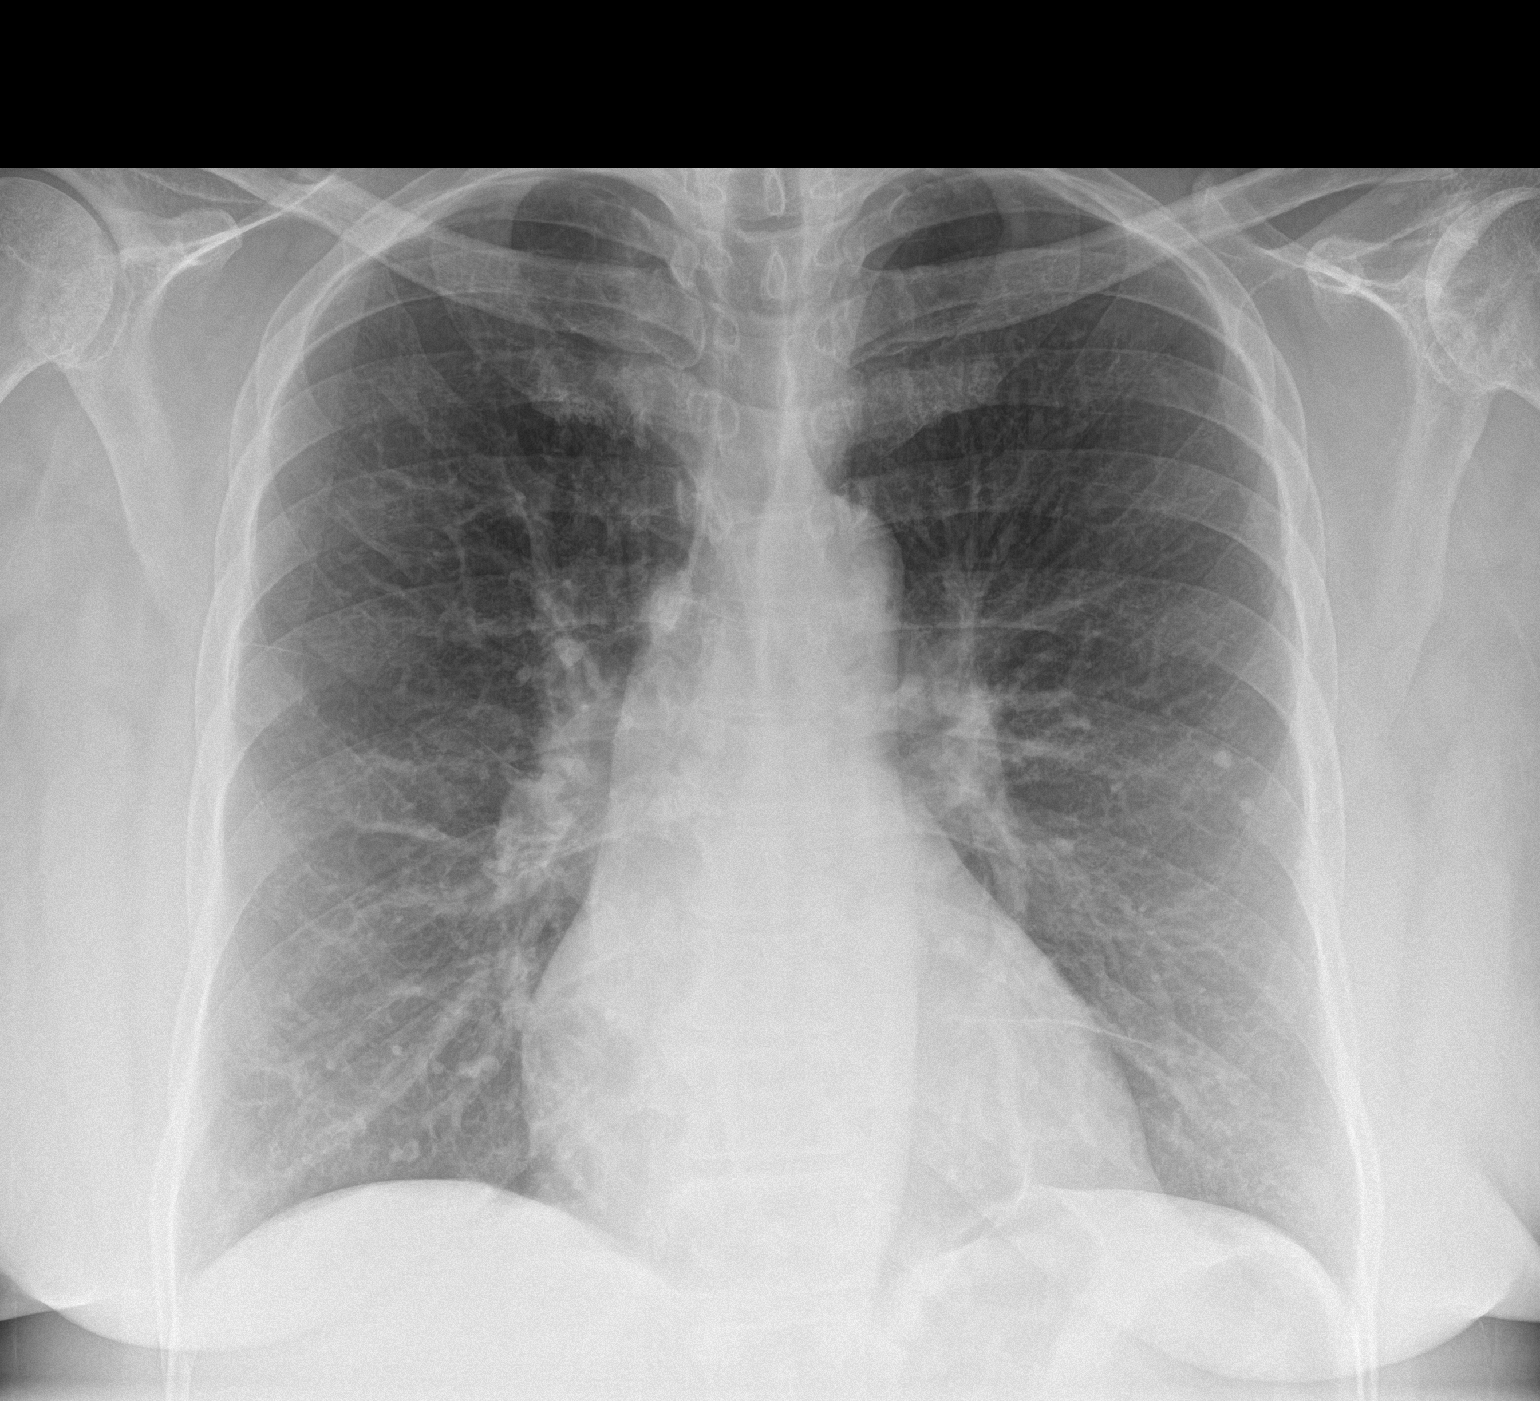
[im 2/2]
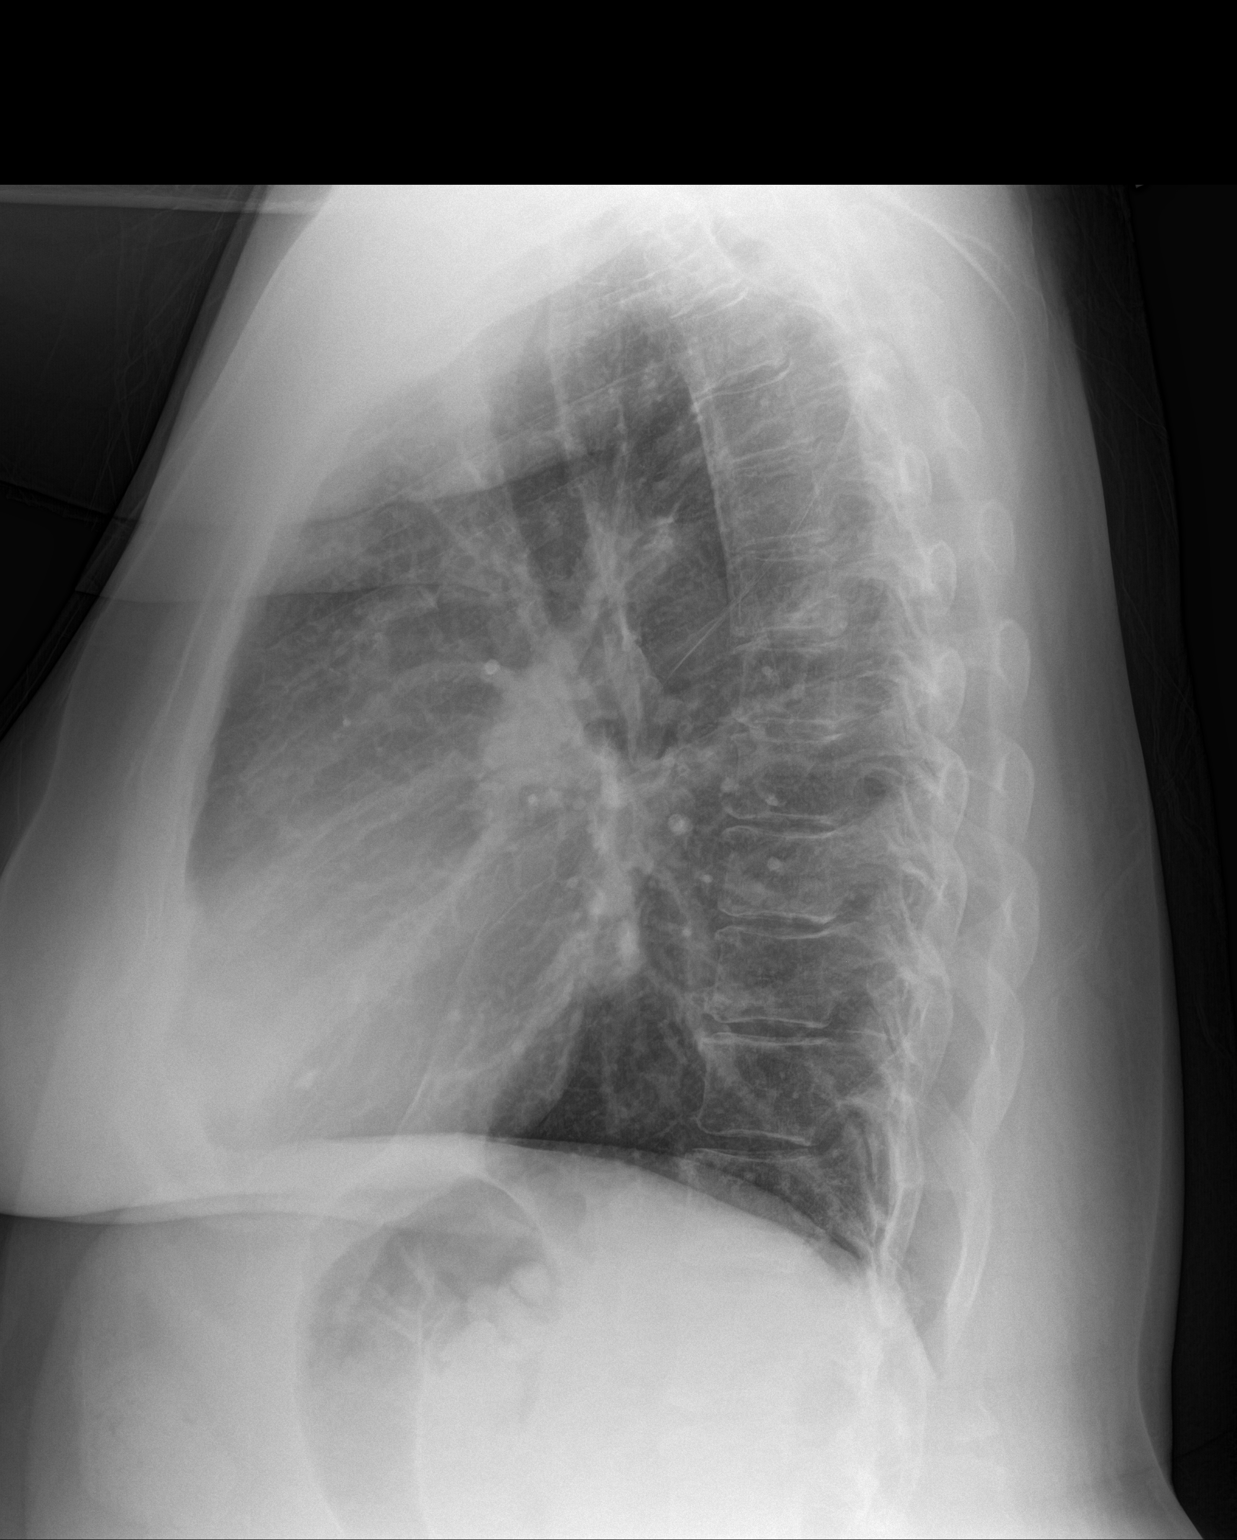

[2 of 2 positions shown; findings below may reference images not displayed]

FINDINGS: The heart size and mediastinal contours are within normal limits.
Both lungs are clear. Small calcified granulomata are identified
throughout the lungs. The visualized skeletal structures are
unremarkable.
IMPRESSION: No evidence for acute cardiopulmonary abnormality.

## 2017-01-10 DIAGNOSIS — Z79899 Other long term (current) drug therapy: Secondary | ICD-10-CM | POA: Diagnosis not present

## 2017-01-10 DIAGNOSIS — M059 Rheumatoid arthritis with rheumatoid factor, unspecified: Secondary | ICD-10-CM | POA: Diagnosis not present

## 2017-01-24 DIAGNOSIS — I10 Essential (primary) hypertension: Secondary | ICD-10-CM | POA: Diagnosis not present

## 2017-02-05 DIAGNOSIS — J4530 Mild persistent asthma, uncomplicated: Secondary | ICD-10-CM | POA: Diagnosis not present

## 2017-02-05 DIAGNOSIS — I10 Essential (primary) hypertension: Secondary | ICD-10-CM | POA: Diagnosis not present

## 2017-02-05 DIAGNOSIS — Z6841 Body Mass Index (BMI) 40.0 and over, adult: Secondary | ICD-10-CM | POA: Diagnosis not present

## 2017-02-24 DIAGNOSIS — M059 Rheumatoid arthritis with rheumatoid factor, unspecified: Secondary | ICD-10-CM | POA: Diagnosis not present

## 2017-02-24 DIAGNOSIS — M7552 Bursitis of left shoulder: Secondary | ICD-10-CM | POA: Diagnosis not present

## 2017-02-24 DIAGNOSIS — M170 Bilateral primary osteoarthritis of knee: Secondary | ICD-10-CM | POA: Diagnosis not present

## 2017-02-24 DIAGNOSIS — Z96651 Presence of right artificial knee joint: Secondary | ICD-10-CM | POA: Diagnosis not present

## 2017-02-24 DIAGNOSIS — Z79899 Other long term (current) drug therapy: Secondary | ICD-10-CM | POA: Diagnosis not present

## 2017-02-24 DIAGNOSIS — Z96641 Presence of right artificial hip joint: Secondary | ICD-10-CM | POA: Diagnosis not present

## 2017-04-08 DIAGNOSIS — J4530 Mild persistent asthma, uncomplicated: Secondary | ICD-10-CM | POA: Diagnosis not present

## 2017-04-08 DIAGNOSIS — K219 Gastro-esophageal reflux disease without esophagitis: Secondary | ICD-10-CM | POA: Diagnosis not present

## 2017-04-08 DIAGNOSIS — J310 Chronic rhinitis: Secondary | ICD-10-CM | POA: Diagnosis not present

## 2017-04-08 DIAGNOSIS — E663 Overweight: Secondary | ICD-10-CM | POA: Diagnosis not present

## 2017-04-08 DIAGNOSIS — R0609 Other forms of dyspnea: Secondary | ICD-10-CM | POA: Diagnosis not present

## 2017-05-02 ENCOUNTER — Other Ambulatory Visit: Payer: Medicare HMO

## 2017-05-08 ENCOUNTER — Other Ambulatory Visit: Payer: Medicare HMO

## 2017-05-22 DIAGNOSIS — M059 Rheumatoid arthritis with rheumatoid factor, unspecified: Secondary | ICD-10-CM | POA: Diagnosis not present

## 2017-05-22 DIAGNOSIS — R600 Localized edema: Secondary | ICD-10-CM | POA: Diagnosis not present

## 2017-05-22 DIAGNOSIS — R7309 Other abnormal glucose: Secondary | ICD-10-CM | POA: Diagnosis not present

## 2017-05-22 DIAGNOSIS — Z6841 Body Mass Index (BMI) 40.0 and over, adult: Secondary | ICD-10-CM | POA: Diagnosis not present

## 2017-05-22 DIAGNOSIS — Z79899 Other long term (current) drug therapy: Secondary | ICD-10-CM | POA: Diagnosis not present

## 2017-05-22 DIAGNOSIS — M25473 Effusion, unspecified ankle: Secondary | ICD-10-CM | POA: Diagnosis not present

## 2017-05-22 DIAGNOSIS — M170 Bilateral primary osteoarthritis of knee: Secondary | ICD-10-CM | POA: Diagnosis not present

## 2017-05-26 DIAGNOSIS — Z96652 Presence of left artificial knee joint: Secondary | ICD-10-CM | POA: Diagnosis not present

## 2017-05-26 DIAGNOSIS — Z96651 Presence of right artificial knee joint: Secondary | ICD-10-CM | POA: Diagnosis not present

## 2017-05-26 DIAGNOSIS — M170 Bilateral primary osteoarthritis of knee: Secondary | ICD-10-CM | POA: Diagnosis not present

## 2017-05-26 DIAGNOSIS — M25471 Effusion, right ankle: Secondary | ICD-10-CM | POA: Diagnosis not present

## 2017-05-26 DIAGNOSIS — R7303 Prediabetes: Secondary | ICD-10-CM | POA: Diagnosis not present

## 2017-05-26 DIAGNOSIS — Z96653 Presence of artificial knee joint, bilateral: Secondary | ICD-10-CM | POA: Diagnosis not present

## 2017-05-26 DIAGNOSIS — M25472 Effusion, left ankle: Secondary | ICD-10-CM | POA: Diagnosis not present

## 2017-05-26 DIAGNOSIS — S86911A Strain of unspecified muscle(s) and tendon(s) at lower leg level, right leg, initial encounter: Secondary | ICD-10-CM | POA: Diagnosis not present

## 2017-05-27 ENCOUNTER — Other Ambulatory Visit: Payer: Self-pay | Admitting: Family Medicine

## 2017-05-27 ENCOUNTER — Ambulatory Visit
Admission: RE | Admit: 2017-05-27 | Discharge: 2017-05-27 | Disposition: A | Discharge status: Home / Self Care | Payer: Medicare HMO | Source: Ambulatory Visit | Attending: Family Medicine | Admitting: Family Medicine

## 2017-05-27 DIAGNOSIS — N6322 Unspecified lump in the left breast, upper inner quadrant: Secondary | ICD-10-CM | POA: Diagnosis not present

## 2017-05-27 DIAGNOSIS — N6320 Unspecified lump in the left breast, unspecified quadrant: Secondary | ICD-10-CM

## 2017-06-23 DIAGNOSIS — Z1211 Encounter for screening for malignant neoplasm of colon: Secondary | ICD-10-CM | POA: Diagnosis not present

## 2017-06-23 DIAGNOSIS — K219 Gastro-esophageal reflux disease without esophagitis: Secondary | ICD-10-CM | POA: Diagnosis not present

## 2017-07-04 DIAGNOSIS — R51 Headache: Secondary | ICD-10-CM | POA: Diagnosis not present

## 2017-07-04 DIAGNOSIS — I10 Essential (primary) hypertension: Secondary | ICD-10-CM | POA: Diagnosis not present

## 2017-07-15 DIAGNOSIS — R05 Cough: Secondary | ICD-10-CM | POA: Diagnosis not present

## 2017-07-15 DIAGNOSIS — J4530 Mild persistent asthma, uncomplicated: Secondary | ICD-10-CM | POA: Diagnosis not present

## 2017-07-15 DIAGNOSIS — Z6841 Body Mass Index (BMI) 40.0 and over, adult: Secondary | ICD-10-CM | POA: Diagnosis not present

## 2017-07-15 DIAGNOSIS — R0609 Other forms of dyspnea: Secondary | ICD-10-CM | POA: Diagnosis not present

## 2017-08-04 DIAGNOSIS — Z6841 Body Mass Index (BMI) 40.0 and over, adult: Secondary | ICD-10-CM | POA: Diagnosis not present

## 2017-08-04 DIAGNOSIS — M059 Rheumatoid arthritis with rheumatoid factor, unspecified: Secondary | ICD-10-CM | POA: Diagnosis not present

## 2017-08-04 DIAGNOSIS — R0602 Shortness of breath: Secondary | ICD-10-CM | POA: Diagnosis not present

## 2017-08-04 DIAGNOSIS — I10 Essential (primary) hypertension: Secondary | ICD-10-CM | POA: Diagnosis not present

## 2017-08-04 DIAGNOSIS — R600 Localized edema: Secondary | ICD-10-CM | POA: Diagnosis not present

## 2017-08-04 DIAGNOSIS — G894 Chronic pain syndrome: Secondary | ICD-10-CM | POA: Diagnosis not present

## 2017-08-04 DIAGNOSIS — J4530 Mild persistent asthma, uncomplicated: Secondary | ICD-10-CM | POA: Diagnosis not present

## 2017-08-04 DIAGNOSIS — R7303 Prediabetes: Secondary | ICD-10-CM | POA: Diagnosis not present

## 2017-08-15 DIAGNOSIS — Z0000 Encounter for general adult medical examination without abnormal findings: Secondary | ICD-10-CM | POA: Diagnosis not present

## 2017-08-15 DIAGNOSIS — R7303 Prediabetes: Secondary | ICD-10-CM | POA: Diagnosis not present

## 2017-08-15 DIAGNOSIS — J4530 Mild persistent asthma, uncomplicated: Secondary | ICD-10-CM | POA: Diagnosis not present

## 2017-08-15 DIAGNOSIS — Z6841 Body Mass Index (BMI) 40.0 and over, adult: Secondary | ICD-10-CM | POA: Diagnosis not present

## 2017-08-15 DIAGNOSIS — F331 Major depressive disorder, recurrent, moderate: Secondary | ICD-10-CM | POA: Diagnosis not present

## 2017-08-15 DIAGNOSIS — I10 Essential (primary) hypertension: Secondary | ICD-10-CM | POA: Diagnosis not present

## 2017-08-15 DIAGNOSIS — Z114 Encounter for screening for human immunodeficiency virus [HIV]: Secondary | ICD-10-CM | POA: Diagnosis not present

## 2017-08-15 DIAGNOSIS — E785 Hyperlipidemia, unspecified: Secondary | ICD-10-CM | POA: Diagnosis not present

## 2017-08-15 DIAGNOSIS — Z7251 High risk heterosexual behavior: Secondary | ICD-10-CM | POA: Diagnosis not present

## 2017-08-21 DIAGNOSIS — R0602 Shortness of breath: Secondary | ICD-10-CM | POA: Diagnosis not present

## 2017-08-22 DIAGNOSIS — M1991 Primary osteoarthritis, unspecified site: Secondary | ICD-10-CM | POA: Diagnosis not present

## 2017-08-22 DIAGNOSIS — M19012 Primary osteoarthritis, left shoulder: Secondary | ICD-10-CM | POA: Diagnosis not present

## 2017-08-22 DIAGNOSIS — M059 Rheumatoid arthritis with rheumatoid factor, unspecified: Secondary | ICD-10-CM | POA: Diagnosis not present

## 2017-08-22 DIAGNOSIS — M25511 Pain in right shoulder: Secondary | ICD-10-CM | POA: Diagnosis not present

## 2017-08-22 DIAGNOSIS — M19011 Primary osteoarthritis, right shoulder: Secondary | ICD-10-CM | POA: Diagnosis not present

## 2017-08-22 DIAGNOSIS — G8929 Other chronic pain: Secondary | ICD-10-CM | POA: Diagnosis not present

## 2017-08-22 DIAGNOSIS — M25512 Pain in left shoulder: Secondary | ICD-10-CM | POA: Diagnosis not present

## 2017-09-10 ENCOUNTER — Encounter: Admission: RE | Payer: Self-pay | Source: Ambulatory Visit

## 2017-09-10 ENCOUNTER — Ambulatory Visit
Admission: RE | Admit: 2017-09-10 | Payer: Medicare HMO | Source: Ambulatory Visit | Admitting: Unknown Physician Specialty

## 2017-09-10 SURGERY — ESOPHAGOGASTRODUODENOSCOPY (EGD) WITH PROPOFOL
Anesthesia: General

## 2017-10-11 DIAGNOSIS — Z0100 Encounter for examination of eyes and vision without abnormal findings: Secondary | ICD-10-CM | POA: Diagnosis not present

## 2017-11-07 ENCOUNTER — Ambulatory Visit
Admission: RE | Admit: 2017-11-07 | Discharge: 2017-11-07 | Disposition: A | Discharge status: Home / Self Care | Payer: Medicare HMO | Source: Ambulatory Visit | Attending: Family Medicine | Admitting: Family Medicine

## 2017-11-07 DIAGNOSIS — N6320 Unspecified lump in the left breast, unspecified quadrant: Secondary | ICD-10-CM

## 2017-11-07 DIAGNOSIS — R928 Other abnormal and inconclusive findings on diagnostic imaging of breast: Secondary | ICD-10-CM | POA: Diagnosis not present

## 2017-11-30 IMAGING — US US EXTREM LOW VENOUS*R*
1 series · 13 of 24 positions shown · non-contrast
Comparison: None.

CLINICAL DATA: Pain and swelling for 1 month since hip surgery.



[Series 1: us extrem low venous*right* · 0.08mm/px · 13 of 34 slices shown]
[im 1/34]
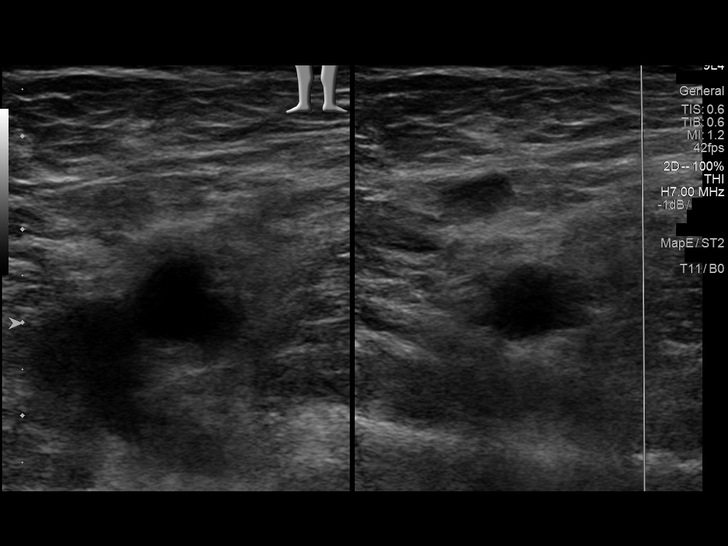
[im 3/34]
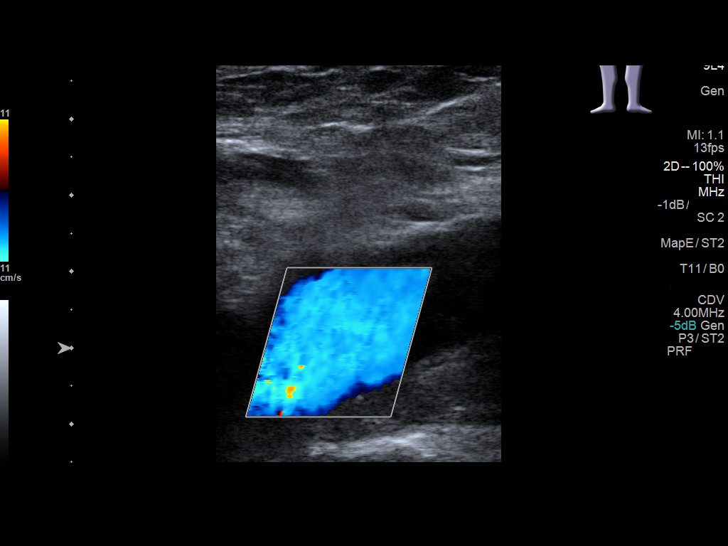
[im 6/34]
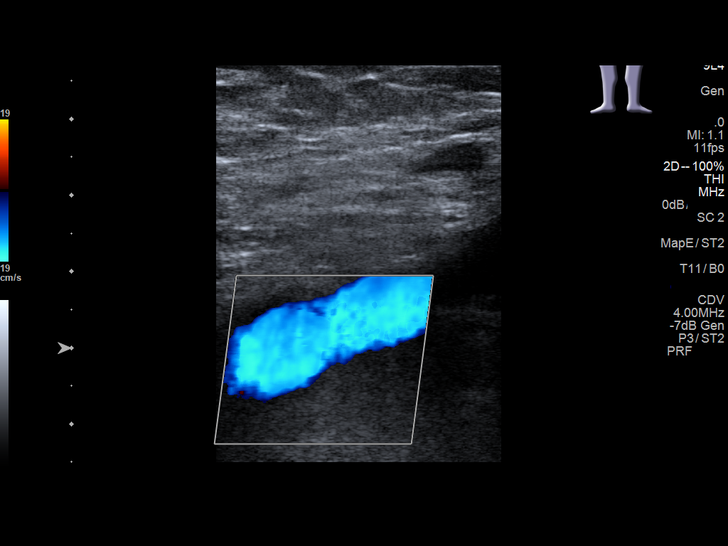
[im 9/34]
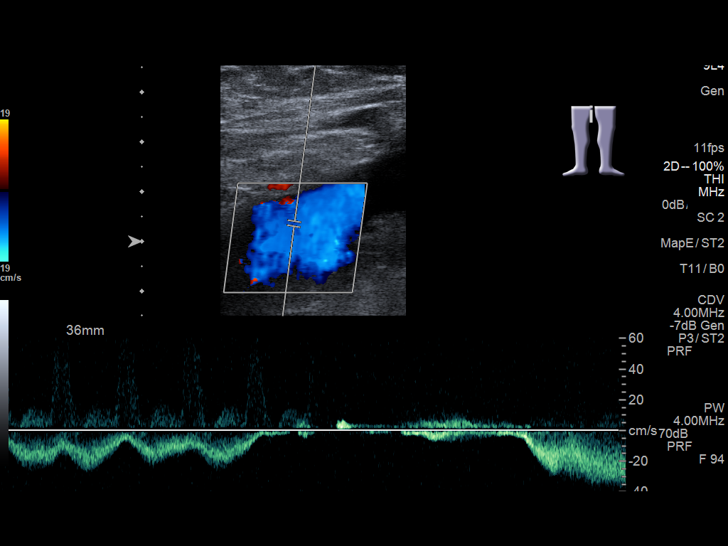
[im 12/34]
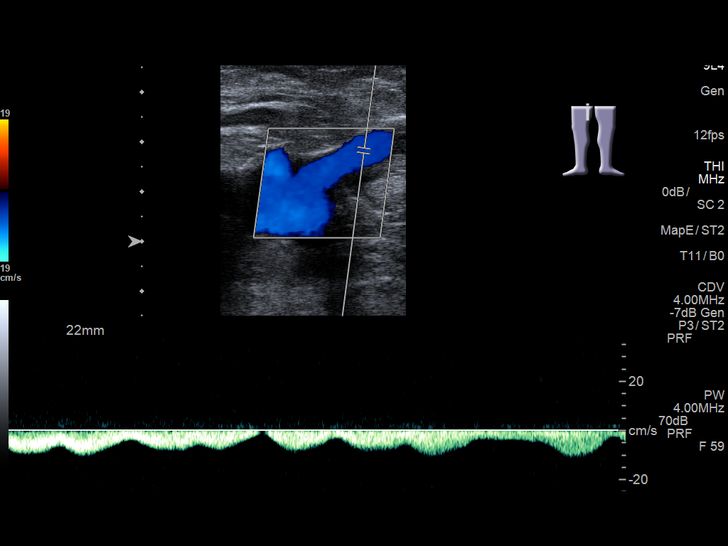
[im 15/34]
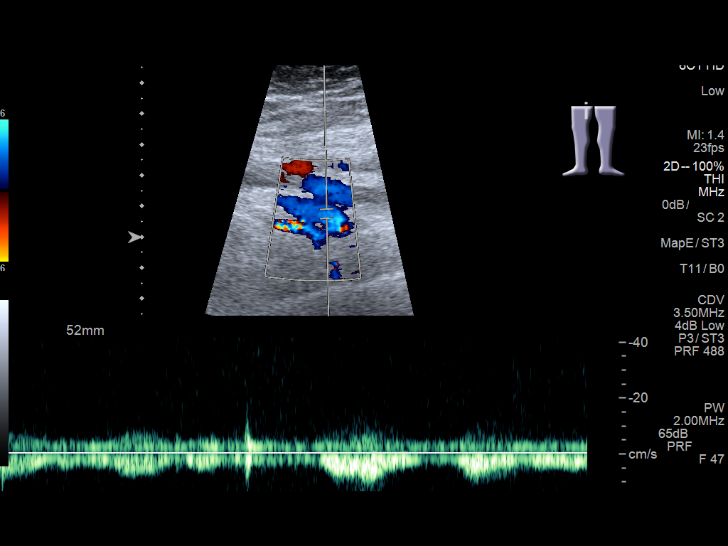
[im 18/34]
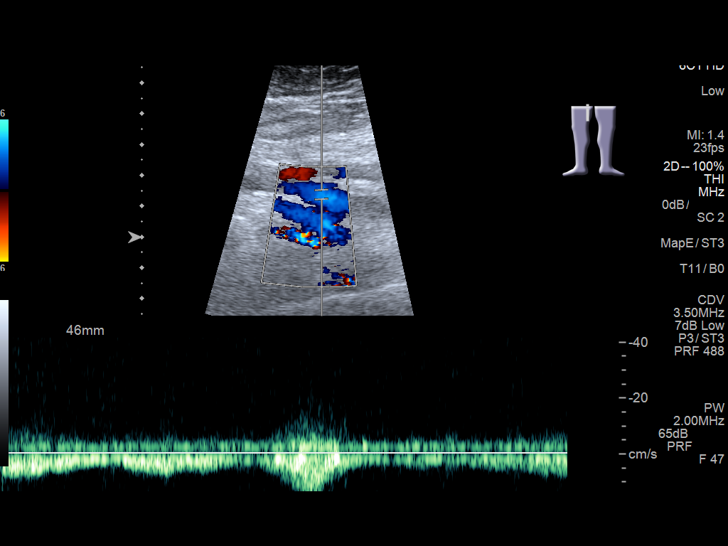
[im 19/34]
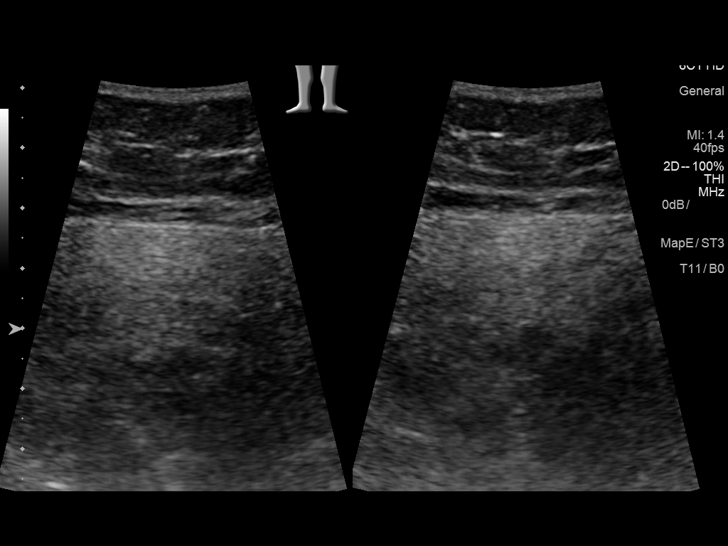
[im 22/34]
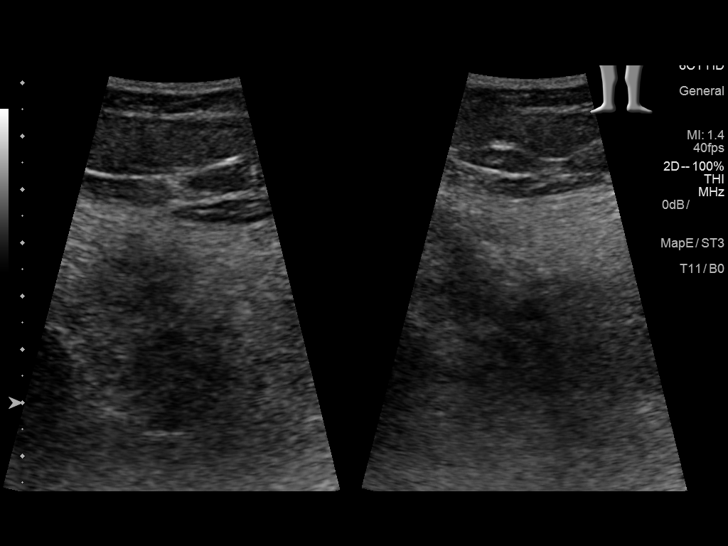
[im 25/34]
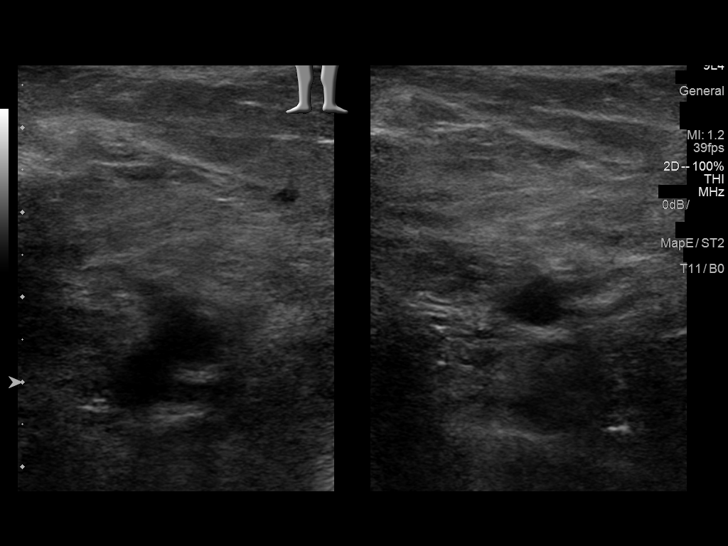
[im 28/34]
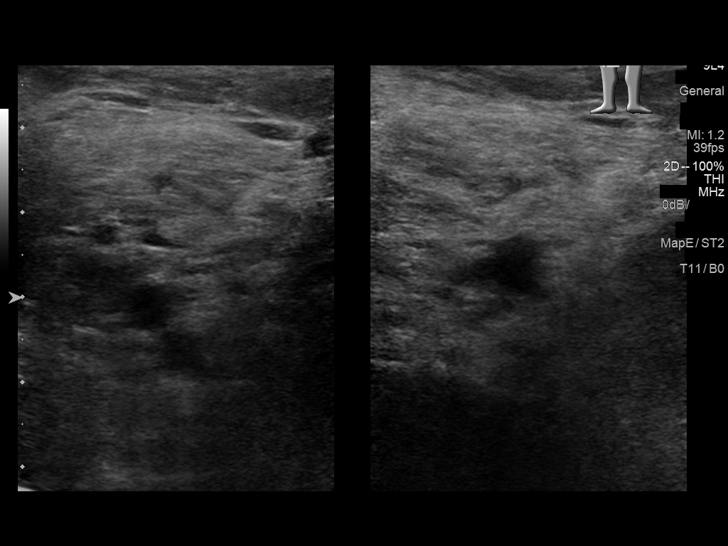
[im 31/34]
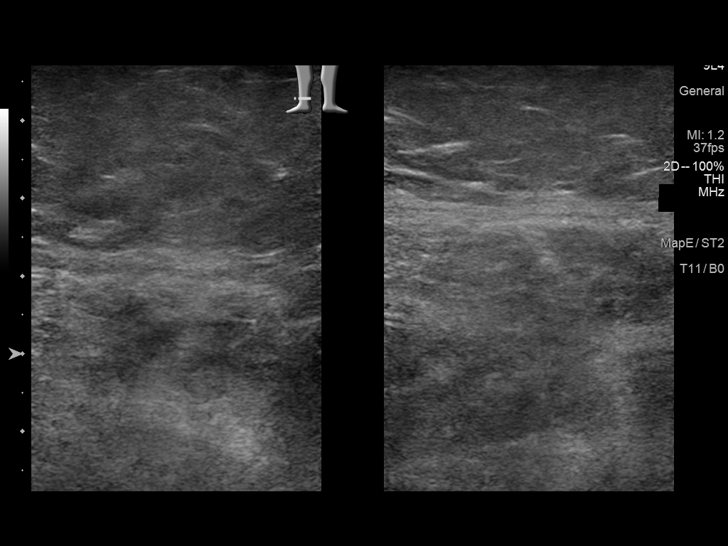
[im 34/34]
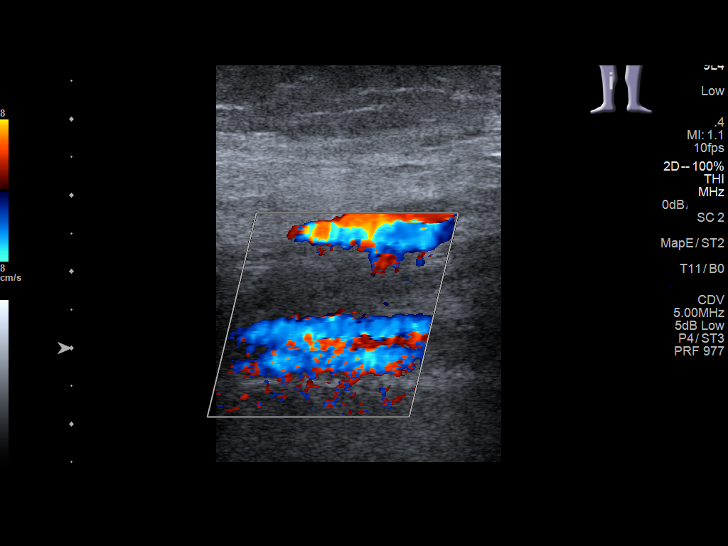

[13 of 24 positions shown; findings below may reference images not displayed]

FINDINGS: Contralateral Common Femoral Vein: Respiratory phasicity is normal
and symmetric with the symptomatic side. No evidence of thrombus.
Normal compressibility.

Common Femoral Vein: No evidence of thrombus. Normal
compressibility, respiratory phasicity and response to augmentation.

Saphenofemoral Junction: No evidence of thrombus. Normal
compressibility and flow on color Doppler imaging.

Profunda Femoral Vein: No evidence of thrombus. Normal
compressibility and flow on color Doppler imaging.

Femoral Vein: No evidence of thrombus. Normal compressibility,
respiratory phasicity and response to augmentation.

Popliteal Vein: No evidence of thrombus. Normal compressibility,
respiratory phasicity and response to augmentation.

Calf Veins: No evidence of thrombus. Normal compressibility and flow
on color Doppler imaging.

Superficial Great Saphenous Vein: No evidence of thrombus. Normal
compressibility and flow on color Doppler imaging.

Venous Reflux:  None.

Other Findings:  None.
IMPRESSION: No evidence of deep venous thrombosis.

## 2017-12-03 IMAGING — RF DG FLUORO GUIDE NDL PLC/BX
2 series · 3 of 3 positions shown · non-contrast
Comparison: none

CLINICAL DATA: Prior total hip replacement.  Pain.

[Series 1: fluoro_iodine 2fps_bw · 0.17mm/px · 2 of 2 frames shown]
[frame 1/2]
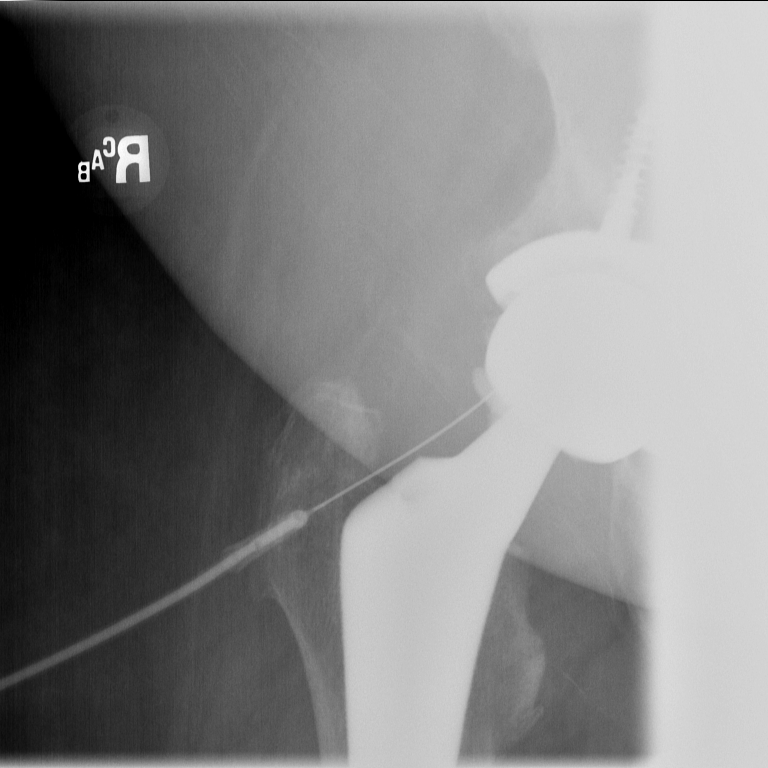
[frame 2/2]
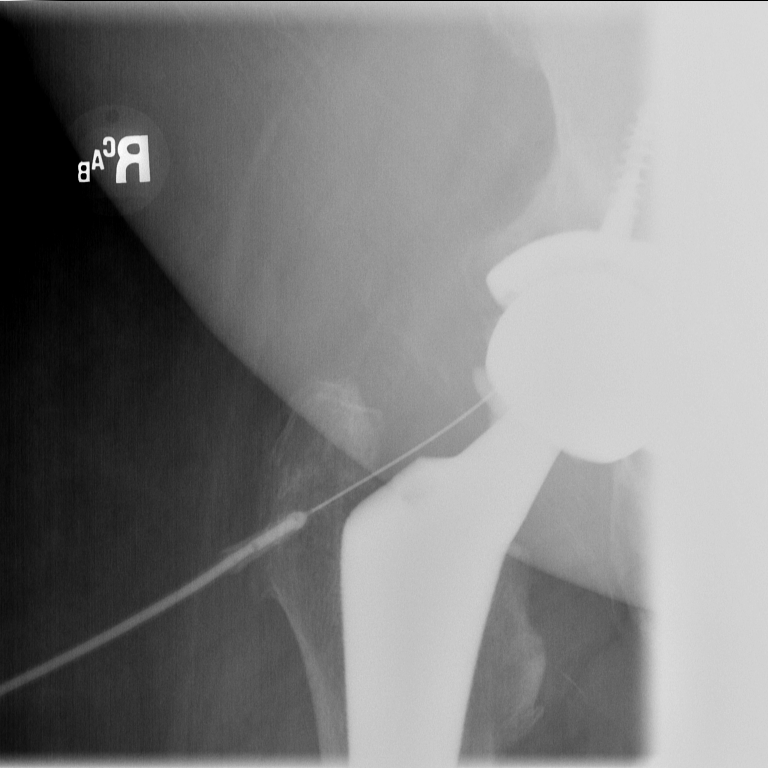

[Series 2: cp_standard · 0.17mm/px · 1 of 1 slices shown]
[im 1/1]
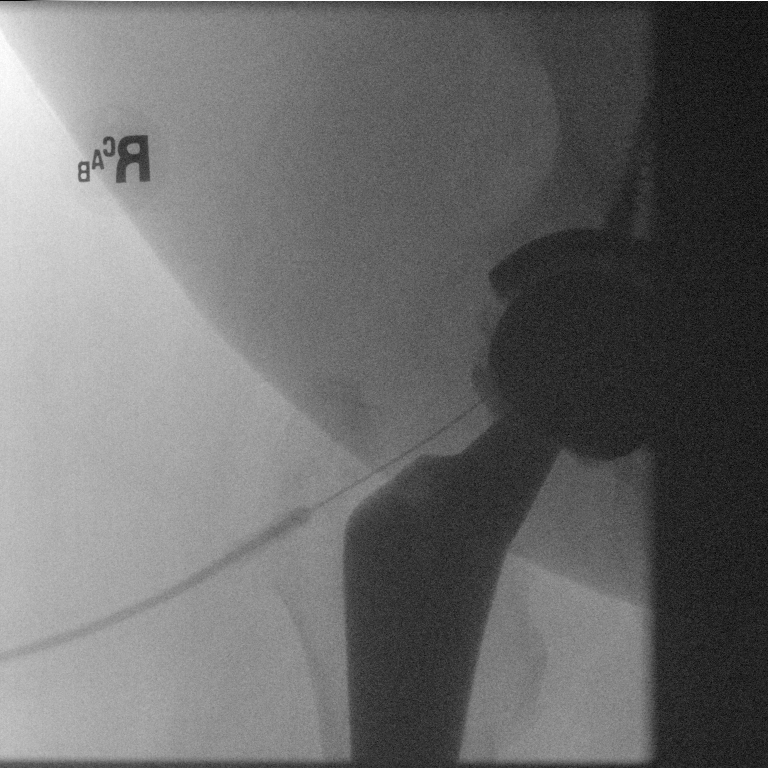

[3 of 3 positions shown; findings below may reference images not displayed]

EXAM:
RIGHT HIP ASPIRATION UNDER FLUOROSCOPY

FLUOROSCOPY TIME:  Fluoroscopy Time:  1 minutes 24 seconds

Number of Acquired Spot Images: 1

PROCEDURE:
Overlying skin prepped with Betadine, draped in the usual sterile
fashion, and infiltrated locally with buffered Lidocaine. Curved 22
gauge spinal needle advanced to the superolateral margin of the
right femoral head. 2 cc of nonionic contrast administered. Five cc
of saline administered. This was followed by aspiration of
approximately 6 cc of slightly blood tinged fluid. Sample was sent
to the lab for wart studies.
IMPRESSION: Successful right hip aspiration under fluoroscopic guidance. Joint
fluid sample sent to lab for studies.

## 2017-12-22 DIAGNOSIS — G894 Chronic pain syndrome: Secondary | ICD-10-CM | POA: Diagnosis not present

## 2017-12-22 DIAGNOSIS — M059 Rheumatoid arthritis with rheumatoid factor, unspecified: Secondary | ICD-10-CM | POA: Diagnosis not present

## 2017-12-22 DIAGNOSIS — Z23 Encounter for immunization: Secondary | ICD-10-CM | POA: Diagnosis not present

## 2017-12-22 DIAGNOSIS — Z6841 Body Mass Index (BMI) 40.0 and over, adult: Secondary | ICD-10-CM | POA: Diagnosis not present

## 2017-12-22 DIAGNOSIS — Z79899 Other long term (current) drug therapy: Secondary | ICD-10-CM | POA: Diagnosis not present

## 2018-02-12 DIAGNOSIS — M059 Rheumatoid arthritis with rheumatoid factor, unspecified: Secondary | ICD-10-CM | POA: Diagnosis not present

## 2018-02-12 DIAGNOSIS — J4530 Mild persistent asthma, uncomplicated: Secondary | ICD-10-CM | POA: Diagnosis not present

## 2018-02-12 DIAGNOSIS — I10 Essential (primary) hypertension: Secondary | ICD-10-CM | POA: Diagnosis not present

## 2018-02-12 DIAGNOSIS — R0602 Shortness of breath: Secondary | ICD-10-CM | POA: Diagnosis not present

## 2018-02-12 DIAGNOSIS — G894 Chronic pain syndrome: Secondary | ICD-10-CM | POA: Diagnosis not present

## 2018-02-12 DIAGNOSIS — R600 Localized edema: Secondary | ICD-10-CM | POA: Diagnosis not present

## 2018-02-12 DIAGNOSIS — R7303 Prediabetes: Secondary | ICD-10-CM | POA: Diagnosis not present

## 2018-02-12 DIAGNOSIS — F331 Major depressive disorder, recurrent, moderate: Secondary | ICD-10-CM | POA: Diagnosis not present

## 2018-02-23 DIAGNOSIS — R7303 Prediabetes: Secondary | ICD-10-CM | POA: Diagnosis not present

## 2018-02-23 DIAGNOSIS — J449 Chronic obstructive pulmonary disease, unspecified: Secondary | ICD-10-CM | POA: Diagnosis not present

## 2018-02-23 DIAGNOSIS — I10 Essential (primary) hypertension: Secondary | ICD-10-CM | POA: Diagnosis not present

## 2018-02-23 DIAGNOSIS — Z6841 Body Mass Index (BMI) 40.0 and over, adult: Secondary | ICD-10-CM | POA: Diagnosis not present

## 2018-02-23 DIAGNOSIS — E785 Hyperlipidemia, unspecified: Secondary | ICD-10-CM | POA: Diagnosis not present

## 2018-02-27 DIAGNOSIS — E785 Hyperlipidemia, unspecified: Secondary | ICD-10-CM | POA: Diagnosis not present

## 2018-02-27 DIAGNOSIS — R7303 Prediabetes: Secondary | ICD-10-CM | POA: Diagnosis not present

## 2018-02-27 DIAGNOSIS — I10 Essential (primary) hypertension: Secondary | ICD-10-CM | POA: Diagnosis not present

## 2018-03-31 DIAGNOSIS — R682 Dry mouth, unspecified: Secondary | ICD-10-CM | POA: Diagnosis not present

## 2018-03-31 DIAGNOSIS — K121 Other forms of stomatitis: Secondary | ICD-10-CM | POA: Diagnosis not present

## 2018-05-25 DIAGNOSIS — R0600 Dyspnea, unspecified: Secondary | ICD-10-CM | POA: Diagnosis not present

## 2018-05-25 DIAGNOSIS — J449 Chronic obstructive pulmonary disease, unspecified: Secondary | ICD-10-CM | POA: Diagnosis not present

## 2018-05-27 DIAGNOSIS — M059 Rheumatoid arthritis with rheumatoid factor, unspecified: Secondary | ICD-10-CM | POA: Diagnosis not present

## 2018-06-04 IMAGING — US US BREAST*L* LIMITED INC AXILLA
1 series · 13 of 13 positions shown · non-contrast
Comparison: 10/22/2016

CLINICAL DATA: Possible masses left breast identified on recent
baseline screening mammogram. The patient is 56 years old and is
asymptomatic.

EXAM:
2D DIGITAL DIAGNOSTIC LEFT MAMMOGRAM WITH ADJUNCT TOMO
ULTRASOUND LEFT BREAST

[Series 1: us breast*left* limited inc axilla · 0.06mm/px · 13 of 13 slices shown]
[im 1/13]
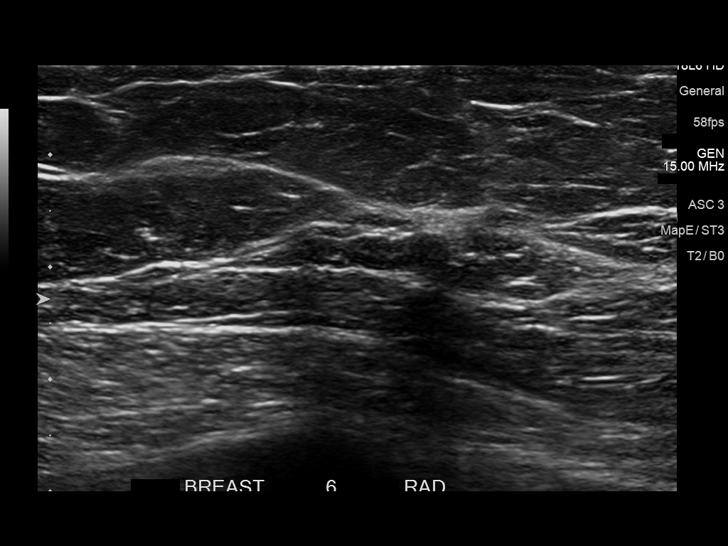
[im 2/13]
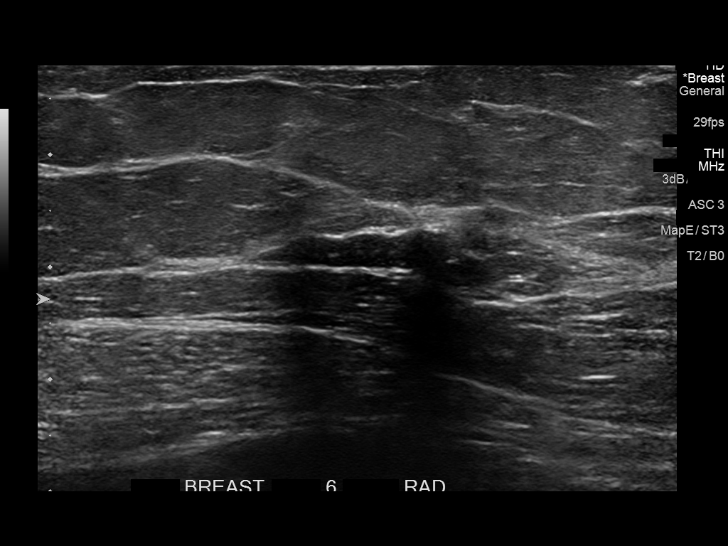
[im 3/13]
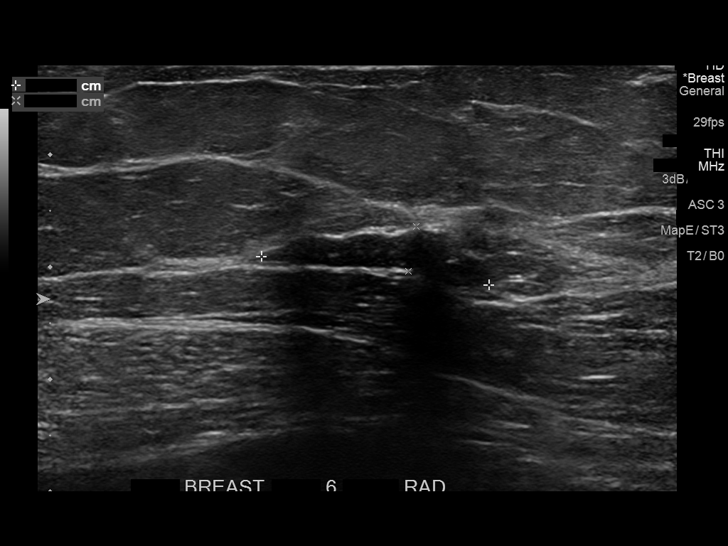
[im 4/13]
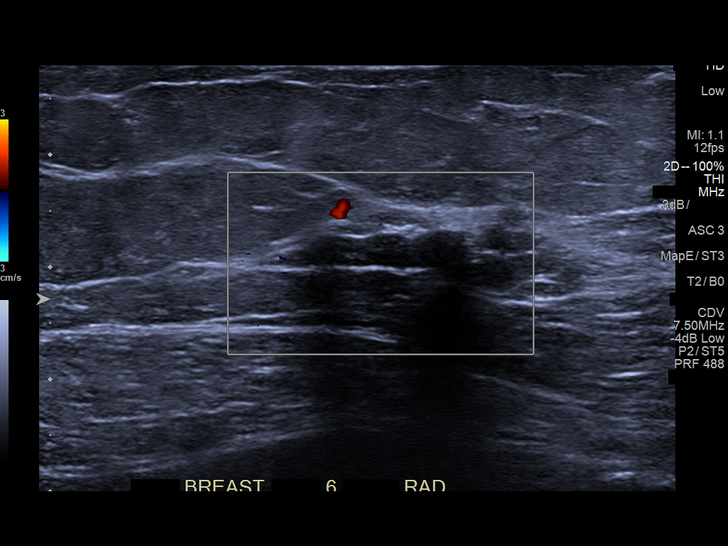
[im 5/13]
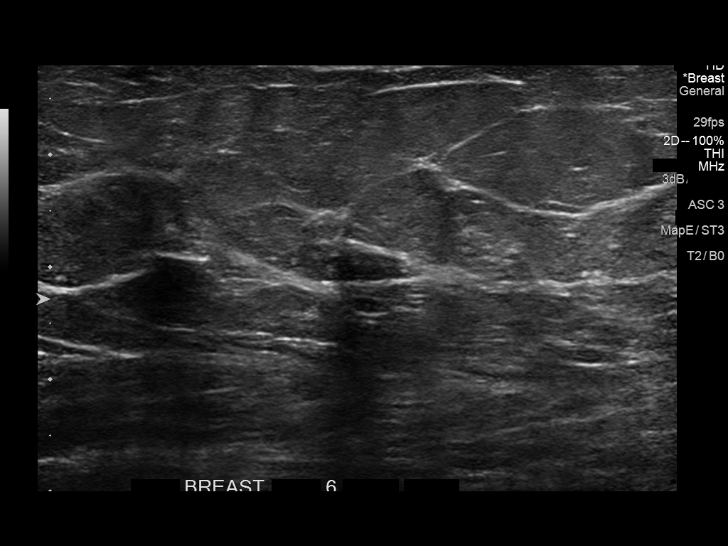
[im 6/13]
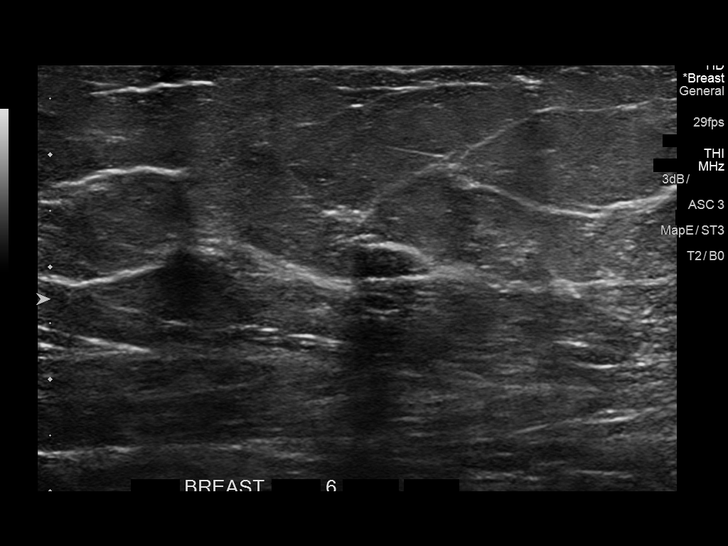
[im 7/13]
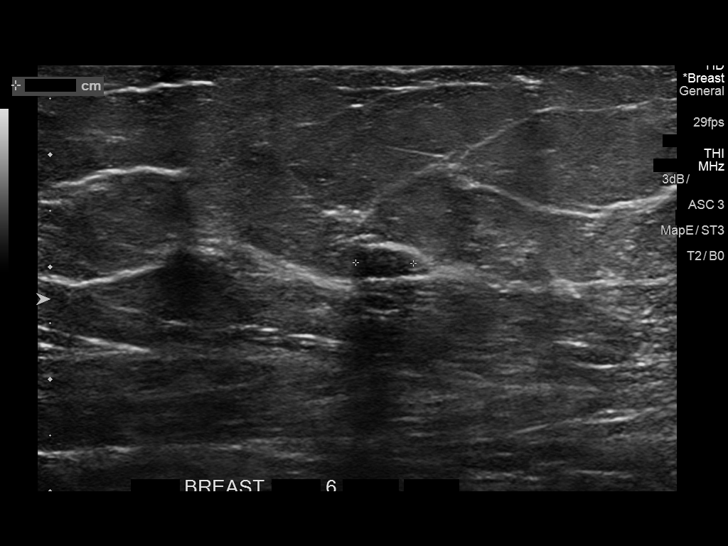
[im 8/13]
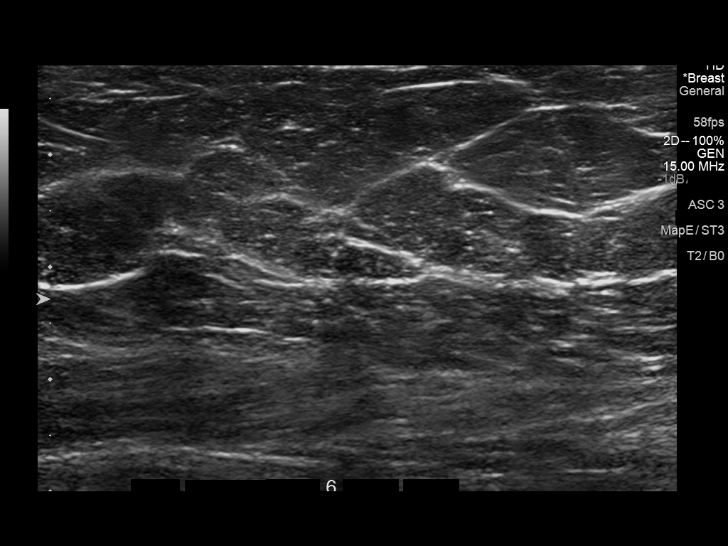
[im 9/13]
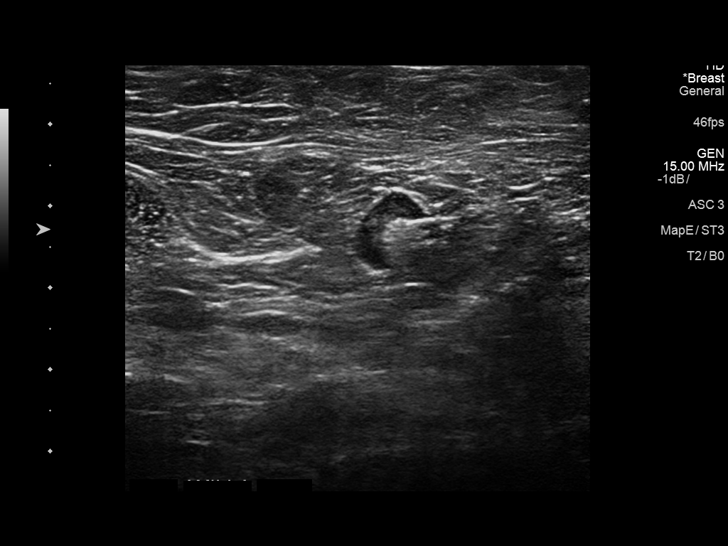
[im 10/13]
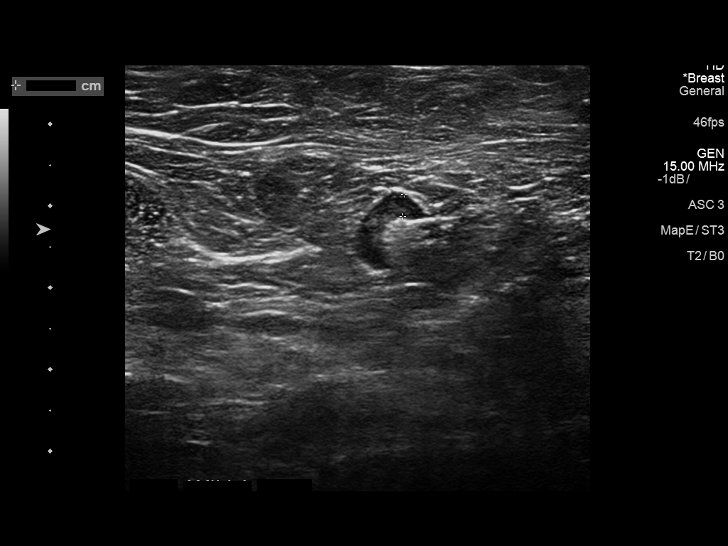
[im 11/13]
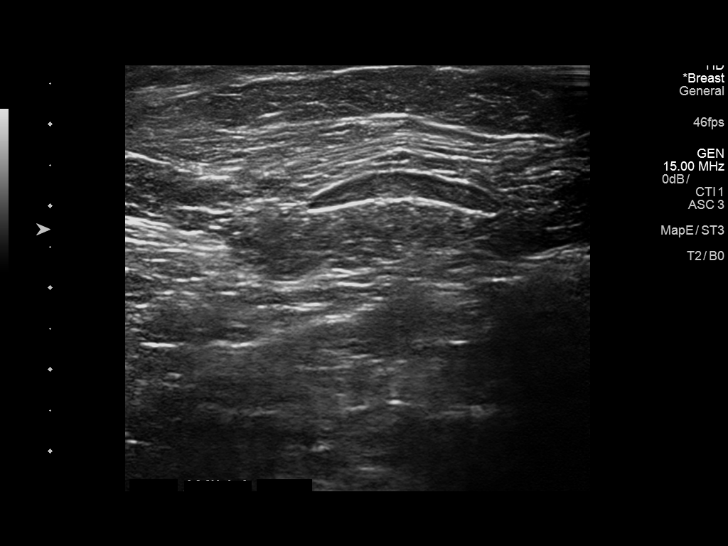
[im 12/13]
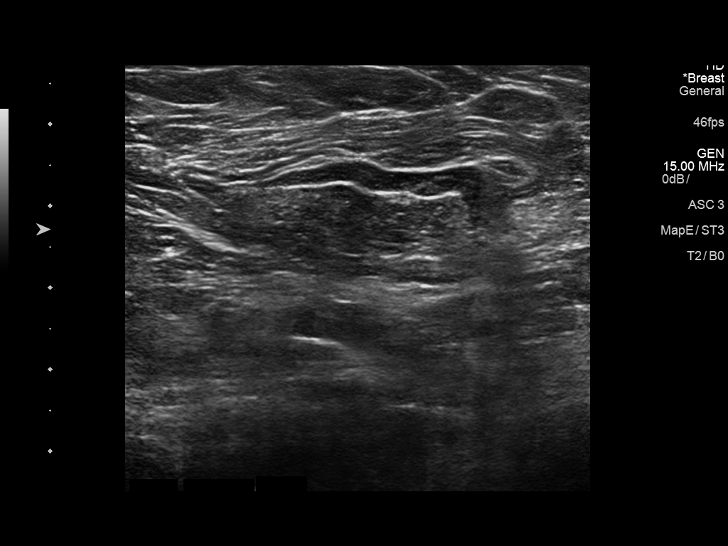
[im 13/13]
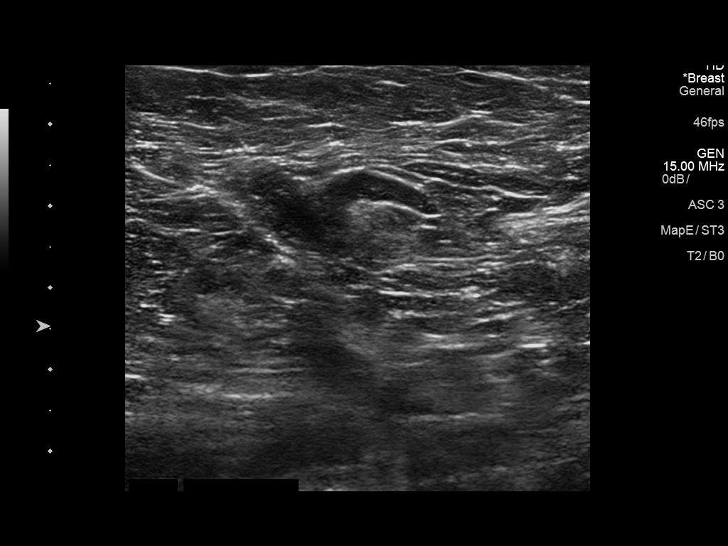

[13 of 13 positions shown; findings below may reference images not displayed]

ACR Breast Density Category b: There are scattered areas of
fibroglandular density.
FINDINGS: Additional spot compression views with tomography of the left breast
performed today confirm one oblong oval mass in the upper inner
quadrant of the left breast. The other areas in question on the
screening mammogram in the retroareolar left breast have appearances
consistent with normal fibroglandular tissue. There is no
architectural distortion.

On physical exam, I do not palpate a mass in the upper inner
quadrant of the left breast.

Targeted ultrasound is performed, showing a homogeneously hypoechoic
oval gently lobulated mass oriented parallel to the chest wall at
[DATE] position 6 cm from the nipple. The mass measures 2.0 x 0.4 x
0.5 cm and corresponds to the mass seen on the mammogram. There is
no associated vascular flow. Negative for left axillary
lymphadenopathy.
IMPRESSION: Probable 2.0 cm fibroadenoma in the upper inner quadrant of the left
breast.

RECOMMENDATION:
The options of follow-up ultrasound in 6 months versus
ultrasound-guided biopsy were discussed with the patient. She
prefers imaging follow-up.

Left breast ultrasound is recommended in 6 months.

I have discussed the findings and recommendations with the patient.
Results were also provided in writing at the conclusion of the
visit. If applicable, a reminder letter will be sent to the patient
regarding the next appointment.

BI-RADS CATEGORY  3: Probably benign.

## 2018-06-25 DIAGNOSIS — M059 Rheumatoid arthritis with rheumatoid factor, unspecified: Secondary | ICD-10-CM | POA: Diagnosis not present

## 2018-06-25 DIAGNOSIS — Z79899 Other long term (current) drug therapy: Secondary | ICD-10-CM | POA: Diagnosis not present

## 2018-07-01 DIAGNOSIS — K121 Other forms of stomatitis: Secondary | ICD-10-CM | POA: Diagnosis not present

## 2018-09-03 DIAGNOSIS — R7303 Prediabetes: Secondary | ICD-10-CM | POA: Diagnosis not present

## 2018-09-03 DIAGNOSIS — Z0000 Encounter for general adult medical examination without abnormal findings: Secondary | ICD-10-CM | POA: Diagnosis not present

## 2018-09-03 DIAGNOSIS — Z1159 Encounter for screening for other viral diseases: Secondary | ICD-10-CM | POA: Diagnosis not present

## 2018-09-03 DIAGNOSIS — I10 Essential (primary) hypertension: Secondary | ICD-10-CM | POA: Diagnosis not present

## 2018-09-22 DIAGNOSIS — Z1212 Encounter for screening for malignant neoplasm of rectum: Secondary | ICD-10-CM | POA: Diagnosis not present

## 2018-09-22 DIAGNOSIS — Z1211 Encounter for screening for malignant neoplasm of colon: Secondary | ICD-10-CM | POA: Diagnosis not present

## 2018-09-28 DIAGNOSIS — M791 Myalgia, unspecified site: Secondary | ICD-10-CM | POA: Diagnosis not present

## 2018-09-28 DIAGNOSIS — R0600 Dyspnea, unspecified: Secondary | ICD-10-CM | POA: Diagnosis not present

## 2018-09-28 DIAGNOSIS — Z79899 Other long term (current) drug therapy: Secondary | ICD-10-CM | POA: Diagnosis not present

## 2018-09-28 DIAGNOSIS — M059 Rheumatoid arthritis with rheumatoid factor, unspecified: Secondary | ICD-10-CM | POA: Diagnosis not present

## 2018-09-28 DIAGNOSIS — J449 Chronic obstructive pulmonary disease, unspecified: Secondary | ICD-10-CM | POA: Diagnosis not present

## 2018-09-28 DIAGNOSIS — M7550 Bursitis of unspecified shoulder: Secondary | ICD-10-CM | POA: Diagnosis not present

## 2018-10-02 DIAGNOSIS — R682 Dry mouth, unspecified: Secondary | ICD-10-CM | POA: Diagnosis not present

## 2018-10-02 DIAGNOSIS — K146 Glossodynia: Secondary | ICD-10-CM | POA: Diagnosis not present

## 2018-10-02 DIAGNOSIS — Z79899 Other long term (current) drug therapy: Secondary | ICD-10-CM | POA: Diagnosis not present

## 2018-10-02 DIAGNOSIS — M059 Rheumatoid arthritis with rheumatoid factor, unspecified: Secondary | ICD-10-CM | POA: Diagnosis not present

## 2018-11-06 DIAGNOSIS — M5489 Other dorsalgia: Secondary | ICD-10-CM | POA: Diagnosis not present

## 2018-11-10 DIAGNOSIS — M545 Low back pain: Secondary | ICD-10-CM | POA: Diagnosis not present

## 2018-11-10 DIAGNOSIS — R399 Unspecified symptoms and signs involving the genitourinary system: Secondary | ICD-10-CM | POA: Diagnosis not present

## 2018-11-10 DIAGNOSIS — B372 Candidiasis of skin and nail: Secondary | ICD-10-CM | POA: Diagnosis not present

## 2018-12-01 ENCOUNTER — Other Ambulatory Visit: Payer: Self-pay | Admitting: Internal Medicine

## 2018-12-01 DIAGNOSIS — M545 Low back pain, unspecified: Secondary | ICD-10-CM

## 2018-12-01 DIAGNOSIS — G8929 Other chronic pain: Secondary | ICD-10-CM

## 2018-12-07 DIAGNOSIS — R69 Illness, unspecified: Secondary | ICD-10-CM | POA: Diagnosis not present

## 2018-12-15 ENCOUNTER — Ambulatory Visit: Payer: 59

## 2019-01-12 DIAGNOSIS — R7303 Prediabetes: Secondary | ICD-10-CM | POA: Diagnosis not present

## 2019-01-12 DIAGNOSIS — M059 Rheumatoid arthritis with rheumatoid factor, unspecified: Secondary | ICD-10-CM | POA: Diagnosis not present

## 2019-01-12 DIAGNOSIS — F331 Major depressive disorder, recurrent, moderate: Secondary | ICD-10-CM | POA: Diagnosis not present

## 2019-01-12 DIAGNOSIS — J4520 Mild intermittent asthma, uncomplicated: Secondary | ICD-10-CM | POA: Diagnosis not present

## 2019-01-12 DIAGNOSIS — I10 Essential (primary) hypertension: Secondary | ICD-10-CM | POA: Diagnosis not present

## 2019-01-12 DIAGNOSIS — E785 Hyperlipidemia, unspecified: Secondary | ICD-10-CM | POA: Diagnosis not present

## 2019-01-12 DIAGNOSIS — Z6841 Body Mass Index (BMI) 40.0 and over, adult: Secondary | ICD-10-CM | POA: Diagnosis not present

## 2019-02-03 DIAGNOSIS — R0600 Dyspnea, unspecified: Secondary | ICD-10-CM | POA: Diagnosis not present

## 2019-02-03 DIAGNOSIS — J449 Chronic obstructive pulmonary disease, unspecified: Secondary | ICD-10-CM | POA: Diagnosis not present

## 2019-02-23 DIAGNOSIS — K121 Other forms of stomatitis: Secondary | ICD-10-CM | POA: Diagnosis not present

## 2019-02-23 DIAGNOSIS — H9011 Conductive hearing loss, unilateral, right ear, with unrestricted hearing on the contralateral side: Secondary | ICD-10-CM | POA: Diagnosis not present

## 2019-02-23 DIAGNOSIS — R682 Dry mouth, unspecified: Secondary | ICD-10-CM | POA: Diagnosis not present

## 2019-02-23 DIAGNOSIS — H6981 Other specified disorders of Eustachian tube, right ear: Secondary | ICD-10-CM | POA: Diagnosis not present

## 2019-02-23 DIAGNOSIS — H6123 Impacted cerumen, bilateral: Secondary | ICD-10-CM | POA: Diagnosis not present

## 2019-03-02 ENCOUNTER — Ambulatory Visit: Payer: Medicare HMO | Attending: Internal Medicine | Admitting: Occupational Therapy

## 2019-03-02 ENCOUNTER — Encounter: Payer: Self-pay | Admitting: Occupational Therapy

## 2019-03-02 ENCOUNTER — Other Ambulatory Visit: Payer: Self-pay

## 2019-03-02 DIAGNOSIS — M79642 Pain in left hand: Secondary | ICD-10-CM | POA: Diagnosis not present

## 2019-03-02 DIAGNOSIS — M25642 Stiffness of left hand, not elsewhere classified: Secondary | ICD-10-CM

## 2019-03-02 DIAGNOSIS — M25641 Stiffness of right hand, not elsewhere classified: Secondary | ICD-10-CM

## 2019-03-02 DIAGNOSIS — M79641 Pain in right hand: Secondary | ICD-10-CM | POA: Diagnosis not present

## 2019-03-02 DIAGNOSIS — M6281 Muscle weakness (generalized): Secondary | ICD-10-CM | POA: Diagnosis not present

## 2019-03-02 DIAGNOSIS — R600 Localized edema: Secondary | ICD-10-CM | POA: Diagnosis not present

## 2019-03-02 NOTE — Therapy (Signed)
[facility] [address] [place], [address] Phone: [phone]   Fax:  [phone]  Occupational Therapy Evaluation  Patient Details  Name: Michelle Dalton MRN: [id number] Date of Birth: 1960/07/06 Referring Provider (OT): bock   Encounter Date: 03/02/2019  OT End of Session - 03/02/19 2155    Visit Number  1    Number of Visits  4    Date for OT Re-Evaluation  03/30/19    OT Start Time  1410    OT Stop Time  1502    OT Time Calculation (min)  52 min    Activity Tolerance  Patient tolerated treatment well    Behavior During Therapy  [facility] for tasks assessed/performed       Past Medical History:  Diagnosis Date  . Anemia   . Anxiety   . Arthritis    rheumatoid arthritis  . Asthma   . Depression   . GERD (gastroesophageal reflux disease)   . Hypertension   . Seasonal allergies     Past Surgical History:  Procedure Laterality Date  . ABDOMINAL HYSTERECTOMY    . ANKLE FRACTURE SURGERY Left   . FRACTURE SURGERY Right    ANKLE with metal  . JOINT REPLACEMENT Bilateral 2015, 2017   TOTAL KNEE REPLACEMENTS  . TOTAL HIP ARTHROPLASTY Right 03/19/2016   Procedure: TOTAL HIP ARTHROPLASTY;  Surgeon: Corky Mull, MD;  Location: [facility] ORS;  Service: Orthopedics;  Laterality: Right;  . TOTAL KNEE ARTHROPLASTY Right 02/28/2015   Procedure: TOTAL KNEE ARTHROPLASTY;  Surgeon: Corky Mull, MD;  Location: [facility] ORS;  Service: Orthopedics;  Laterality: Right;  . TUBAL LIGATION      There were no vitals filed for this visit.  Subjective Assessment - 03/02/19 2147    Subjective   My hands had been bothering me more than 6 months with stiffness, pain , soreness and swelling - my neck and shoulders too - I am just falling apart - if I use my hands I pay for it the next day - mostly over my knuckles R hand worse than L , thumb and index finger    Pertinent History  Seen Dr Meda Coffee on 09/28/2018 - Hands hurting more  .morning stiffness, both shoulders hurt especially if she is carrying groceries or Is taking them out of the car. No recent trauma. Prior left shoulder bursitis injections, patient was to contact Dr. Roland Rack . Methotrexate is taken appropriately weekly, no episodes of oral stomatitis, no out of the ordinary dyspnea of sustained dry cough, no GI discomfort    Patient Stated Goals  I want my hands , shoulders and neck to feel better- get the pain and stiffness better so I can use and and feel better    Currently in Pain?  Yes    Pain Score  4     Pain Location  Hand    Pain Orientation  Right;Left    Pain Descriptors / Indicators  Aching;Tender;Tightness   swllen   Pain Type  Chronic pain    Pain Onset  More than a month ago    Pain Frequency  Constant    Aggravating Factors   using them and in the morning        [facility] OT Assessment - 03/02/19 0001      Assessment   Medical Diagnosis  OA with hand stiffness and pain     Referring Provider (OT)  bock    Onset  Date/Surgical Date  09/28/18    Hand Dominance  Right    Prior Therapy  --   for her knees had PT      Home  Environment   Lives With  Alone      Prior Function   Vocation  --   do not work    Leisure  read, on phone , cook and do own cleaning , some shopping       Strength   Right Hand Grip (lbs)  25    Right Hand Lateral Pinch  9 lbs    Right Hand 3 Point Pinch  9 lbs    Left Hand Grip (lbs)  26    Left Hand Lateral Pinch  15 lbs    Left Hand 3 Point Pinch  10 lbs      Right Hand AROM   R Index  MCP 0-90  85 Degrees    R Index PIP 0-100  95 Degrees    R Long  MCP 0-90  90 Degrees    R Long PIP 0-100  100 Degrees    R Ring  MCP 0-90  90 Degrees    R Ring PIP 0-100  100 Degrees    R Little  MCP 0-90  90 Degrees    R Little PIP 0-100  95 Degrees      Left Hand AROM   L Index  MCP 0-90  85 Degrees    L Index PIP 0-100  90 Degrees    L Long  MCP 0-90  90 Degrees    L Long PIP 0-100  95 Degrees    L Ring  MCP 0-90   90 Degrees    L Ring PIP 0-100  95 Degrees    L Little  MCP 0-90  90 Degrees    L Little PIP 0-100  90 Degrees       contrast done to bilateral hands prior to review of HEP   hand out provided   Contrast  Tendon glides - gentle AROM - pain free  cervical lateral flexion , rotation and ext, flexion In shower - R and L  Cervical pillow look into   Joint protection principles ed on  And AE  isotoner glove fitted on R hand to use night time and some during day  Keep pain under 2/10             OT Education - 03/02/19 2155    Education Details  findings of eval and homeprogram    Person(s) Educated  Patient    Methods  Explanation;Demonstration;Tactile cues;Verbal cues;Handout    Comprehension  Verbal cues required;Returned demonstration;Verbalized understanding       OT Short Term Goals - 03/02/19 2201      OT SHORT TERM GOAL #1   Title  Pt to be independent in HEP to decrease stiffness , pain and sweling in bilateral hands to less than 3/10 at the worse    Baseline  pain 4-10/10 per pt with use , fisting and in am -    Time  2    Period  Weeks    Status  New    Target Date  03/16/19        OT Long Term Goals - 03/02/19 2202      OT LONG TERM GOAL #1   Title  PRWHE for pain decrease with more than 20 points    Baseline  at eval pain on PRHWE 37/50  Time  4    Period  Weeks    Status  New    Target Date  03/30/19      OT LONG TERM GOAL #2   Title  Pt to verbalize 3 joint protection prinicples to decrease pain and stiffness and icnrease ease use of hands in ADL's    Baseline  no knowledge    Time  4    Period  Weeks    Status  New    Target Date  03/30/19      OT LONG TERM GOAL #3   Title  Grip strength increase with more than 5 lbs to grip objects better and without increase symptoms    Baseline  Grip R 25, L 26 lbs    Time  4    Period  Weeks    Status  New    Target Date  03/30/19            Plan - 03/02/19 2158    Clinical  Impression Statement  Pt present at OT eval with diagnosis of OA - with hand stiffness, pain ,swelling in the morning and with use - pain 4-10/10 per pt , R hand worse than L , pt AROM in bilateral hands WFL - but decrease end range and pain with composite fist , decrease grip strength  and lat grip on R- lmiting her functionals use in ADL's and IADL's - pt reportt neck and shoujlder pain and stiffness -pt to see PT to adress than    OT Occupational Profile and History  Problem Focused Assessment - Including review of records relating to presenting problem    Occupational performance deficits (Please refer to evaluation for details):  ADL's;IADL's;Play;Leisure;Social Participation    Body Structure / Function / Physical Skills  ADL;Flexibility;ROM;UE functional use;Edema;Pain;Strength;IADL    Rehab Potential  Good    Clinical Decision Making  Limited treatment options, no task modification necessary    Comorbidities Affecting Occupational Performance:  None    Modification or Assistance to Complete Evaluation   No modification of tasks or assist necessary to complete eval    OT Frequency  1x / week    OT Duration  4 weeks    OT Treatment/Interventions  Self-care/ADL training;Therapeutic exercise;Patient/family education;Paraffin;Contrast Bath;Manual Therapy    Plan  assess progress with HEP ,    OT Home Exercise Plan  see pt instruction       Patient will benefit from skilled therapeutic intervention in order to improve the following deficits and impairments:   Body Structure / Function / Physical Skills: ADL, Flexibility, ROM, UE functional use, Edema, Pain, Strength, IADL       Visit Diagnosis: Stiffness of left hand, not elsewhere classified - Plan: Ot plan of care cert/re-cert  Stiffness of right hand, not elsewhere classified - Plan: Ot plan of care cert/re-cert  Pain in left hand - Plan: Ot plan of care cert/re-cert  Pain in right hand - Plan: Ot plan of care  cert/re-cert  Localized edema - Plan: Ot plan of care cert/re-cert  Muscle weakness (generalized) - Plan: Ot plan of care cert/re-cert    Problem List Patient Active Problem List   Diagnosis Date Noted  . Status post total hip replacement, right 03/19/2016  . 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/28/2015    Rosalyn Gess OTR/L,CLT 03/02/2019, 10:06 PM  [facility] [address] [place], [address] Phone: [phone]   Fax:  [phone]  Name: Michelle Dalton MRN: [id number] Date of Birth: [birth year]

## 2019-03-02 NOTE — Patient Instructions (Signed)
Contrast  Tendon glides - gentle AROM - pain free  cervical lateral flexion , rotation and ext, flexion In shower - R and L  Cervical pillow look into   Joint protection principles ed on  And AE  isotoner glove fitted on R hand to use night time and some during day  Keep pain under 2/10

## 2019-03-04 DIAGNOSIS — M542 Cervicalgia: Secondary | ICD-10-CM | POA: Diagnosis not present

## 2019-03-12 ENCOUNTER — Ambulatory Visit: Payer: Medicare HMO | Attending: Internal Medicine | Admitting: Occupational Therapy

## 2019-04-26 DIAGNOSIS — Z96653 Presence of artificial knee joint, bilateral: Secondary | ICD-10-CM | POA: Diagnosis not present

## 2019-04-26 DIAGNOSIS — S8002XA Contusion of left knee, initial encounter: Secondary | ICD-10-CM | POA: Diagnosis not present

## 2019-04-26 DIAGNOSIS — M25562 Pain in left knee: Secondary | ICD-10-CM | POA: Diagnosis not present

## 2019-04-26 DIAGNOSIS — M25561 Pain in right knee: Secondary | ICD-10-CM | POA: Diagnosis not present

## 2019-04-26 DIAGNOSIS — S8001XA Contusion of right knee, initial encounter: Secondary | ICD-10-CM | POA: Diagnosis not present

## 2019-05-06 DIAGNOSIS — M059 Rheumatoid arthritis with rheumatoid factor, unspecified: Secondary | ICD-10-CM | POA: Diagnosis not present

## 2019-05-06 DIAGNOSIS — Z111 Encounter for screening for respiratory tuberculosis: Secondary | ICD-10-CM | POA: Diagnosis not present

## 2019-05-06 DIAGNOSIS — Z79899 Other long term (current) drug therapy: Secondary | ICD-10-CM | POA: Diagnosis not present

## 2019-06-23 DIAGNOSIS — H9011 Conductive hearing loss, unilateral, right ear, with unrestricted hearing on the contralateral side: Secondary | ICD-10-CM | POA: Diagnosis not present

## 2019-06-23 DIAGNOSIS — R7611 Nonspecific reaction to tuberculin skin test without active tuberculosis: Secondary | ICD-10-CM | POA: Diagnosis not present

## 2019-06-23 DIAGNOSIS — R7612 Nonspecific reaction to cell mediated immunity measurement of gamma interferon antigen response without active tuberculosis: Secondary | ICD-10-CM | POA: Diagnosis not present

## 2019-06-28 ENCOUNTER — Ambulatory Visit: Payer: Self-pay | Admitting: Family Medicine

## 2019-06-28 ENCOUNTER — Other Ambulatory Visit: Payer: Self-pay

## 2019-06-28 NOTE — Progress Notes (Signed)
Call to Patient to f/u on +QFT, patient referred from [facility] clinic. Chest xray on 06/23/2019.  Epi questions answered via phone. Patient reports medical history to include, OA, RA, HTN, COPD and Asthma. Patient informed of the need to HIV and syphilis to be drawn.  Patient reports history of + syphilis.  Will call patient to have scheduled for bloodwork and TB med start. Junious Dresser, RN

## 2019-06-29 ENCOUNTER — Encounter: Payer: Self-pay | Admitting: Family Medicine

## 2019-06-29 DIAGNOSIS — H9201 Otalgia, right ear: Secondary | ICD-10-CM | POA: Diagnosis not present

## 2019-06-29 DIAGNOSIS — M069 Rheumatoid arthritis, unspecified: Secondary | ICD-10-CM

## 2019-06-29 DIAGNOSIS — H6981 Other specified disorders of Eustachian tube, right ear: Secondary | ICD-10-CM | POA: Diagnosis not present

## 2019-06-29 DIAGNOSIS — J449 Chronic obstructive pulmonary disease, unspecified: Secondary | ICD-10-CM

## 2019-06-29 NOTE — Progress Notes (Signed)
Patient was sent to [facility] due to positive quantiferon gold. Work up shows CXR negative. 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 Recommend treatment for TB per standing order. Reviewed labs in care everywhere and patient had normal creatinine in April 2021 as well as normal LFT.   Recommend treatment with Rifampin 600mg  daily x 4 months. LFTs and creatinine monthly while on therapy due to multiple antihypertensives (hydralazine, HCTZ, labetalol) with NSAIDs    Caren Macadam, MD, MPH, ABFM [facility] Medical Director

## 2019-07-02 ENCOUNTER — Ambulatory Visit (LOCAL_COMMUNITY_HEALTH_CENTER): Payer: Self-pay

## 2019-07-02 ENCOUNTER — Other Ambulatory Visit: Payer: Self-pay

## 2019-07-02 VITALS — Wt 242.0 lb

## 2019-07-02 DIAGNOSIS — Z227 Latent tuberculosis: Secondary | ICD-10-CM

## 2019-07-02 DIAGNOSIS — R7612 Nonspecific reaction to cell mediated immunity measurement of gamma interferon antigen response without active tuberculosis: Secondary | ICD-10-CM

## 2019-07-02 MED ORDER — RIFAMPIN 300 MG PO CAPS: 600.0000 mg | ORAL_CAPSULE | Freq: Every day | ORAL | 3 refills | Status: AC

## 2019-07-02 NOTE — Progress Notes (Signed)
Pt states there have not been any changes to her medication list or history since talking with [facility] RN on 06/28/19 for positive QFT Gold interview and LTBI tx information. See pt's current symptoms and complaints (patients baseline) dated 06/28/2019, before starting Rifampin. Pt counseled to stop Rifampin immediately and call [facility] if she experiences any new side effects or worsening symptoms (anything outside her normal) after starting Rifampin. Pt reviewed and signed consents, Rifampin information sheet reviewed and provided, pt sent to lab for HIV and syphilis bloodwork. Dispense #1 bottle of Rifampin 300 mg #60 tablets to take 2 tablets PO daily per Dr Lauretta Chester order dated 06/29/19. Scheduled LTBI med appt for 07/30/19 for 2nd bottle of Rifampin.

## 2019-07-07 ENCOUNTER — Telehealth: Payer: Self-pay | Admitting: Family Medicine

## 2019-07-07 ENCOUNTER — Telehealth: Payer: Self-pay

## 2019-07-07 DIAGNOSIS — R7612 Nonspecific reaction to cell mediated immunity measurement of gamma interferon antigen response without active tuberculosis: Secondary | ICD-10-CM | POA: Insufficient documentation

## 2019-07-07 NOTE — Telephone Encounter (Signed)
TC with Vicente Males at [facility].  Informed Vicente Males that per patient TC this am, patient stated that she does not want to take Rifampin (LTBI tx)  and will not be starting Humira; stated has appt tomorrow with RA clinic. RN has discussed in depth numerous times disease process for TB/LTBI and need for tx if continues with immunosuppressive drugs. Vicente Males will inform Dr. Meda Coffee. Aileen Fass, RN

## 2019-07-07 NOTE — Telephone Encounter (Signed)
Call to patient d/t patient telling this nurse that she was going to see her PCP and have bloodwork done.  I ask patient to have lab results sent to [facility].  The patient reported that she discussed it with her family and she was not going to take the LTBI medications or the RA medications.  Patient gave verbal declination over the phone, witness by M. Dorminy, Therapist, sports.   Patient educated on if TB was to become active that medications would  be required and that her decision was respected at this time.    Declination mailed to patient d/t limited transportation.   Patient verbalized understanding.  Junious Dresser, RN

## 2019-07-08 DIAGNOSIS — R7303 Prediabetes: Secondary | ICD-10-CM | POA: Diagnosis not present

## 2019-07-08 DIAGNOSIS — I10 Essential (primary) hypertension: Secondary | ICD-10-CM | POA: Diagnosis not present

## 2019-07-09 DIAGNOSIS — H25011 Cortical age-related cataract, right eye: Secondary | ICD-10-CM | POA: Diagnosis not present

## 2019-07-13 ENCOUNTER — Other Ambulatory Visit: Payer: Self-pay | Admitting: Family Medicine

## 2019-07-13 DIAGNOSIS — R21 Rash and other nonspecific skin eruption: Secondary | ICD-10-CM | POA: Diagnosis not present

## 2019-07-13 DIAGNOSIS — I10 Essential (primary) hypertension: Secondary | ICD-10-CM | POA: Diagnosis not present

## 2019-07-13 DIAGNOSIS — R7303 Prediabetes: Secondary | ICD-10-CM | POA: Diagnosis not present

## 2019-07-13 DIAGNOSIS — Z6841 Body Mass Index (BMI) 40.0 and over, adult: Secondary | ICD-10-CM | POA: Diagnosis not present

## 2019-07-13 DIAGNOSIS — Z1159 Encounter for screening for other viral diseases: Secondary | ICD-10-CM | POA: Diagnosis not present

## 2019-07-13 DIAGNOSIS — R7611 Nonspecific reaction to tuberculin skin test without active tuberculosis: Secondary | ICD-10-CM | POA: Diagnosis not present

## 2019-07-13 DIAGNOSIS — N6320 Unspecified lump in the left breast, unspecified quadrant: Secondary | ICD-10-CM

## 2019-07-20 DIAGNOSIS — H6981 Other specified disorders of Eustachian tube, right ear: Secondary | ICD-10-CM | POA: Diagnosis not present

## 2019-07-28 DIAGNOSIS — M8949 Other hypertrophic osteoarthropathy, multiple sites: Secondary | ICD-10-CM | POA: Diagnosis not present

## 2019-07-28 DIAGNOSIS — Z79899 Other long term (current) drug therapy: Secondary | ICD-10-CM | POA: Diagnosis not present

## 2019-07-28 DIAGNOSIS — Z227 Latent tuberculosis: Secondary | ICD-10-CM | POA: Diagnosis not present

## 2019-07-28 DIAGNOSIS — M059 Rheumatoid arthritis with rheumatoid factor, unspecified: Secondary | ICD-10-CM | POA: Diagnosis not present

## 2019-08-03 DIAGNOSIS — Z227 Latent tuberculosis: Secondary | ICD-10-CM | POA: Diagnosis not present

## 2019-08-03 DIAGNOSIS — R7611 Nonspecific reaction to tuberculin skin test without active tuberculosis: Secondary | ICD-10-CM | POA: Diagnosis not present

## 2019-08-03 DIAGNOSIS — M069 Rheumatoid arthritis, unspecified: Secondary | ICD-10-CM | POA: Diagnosis not present

## 2019-10-14 ENCOUNTER — Other Ambulatory Visit: Payer: Self-pay

## 2019-10-14 ENCOUNTER — Ambulatory Visit (INDEPENDENT_AMBULATORY_CARE_PROVIDER_SITE_OTHER): Payer: Medicare HMO | Admitting: Dermatology

## 2019-10-14 DIAGNOSIS — Z8739 Personal history of other diseases of the musculoskeletal system and connective tissue: Secondary | ICD-10-CM

## 2019-10-14 DIAGNOSIS — Z840 Family history of diseases of the skin and subcutaneous tissue: Secondary | ICD-10-CM | POA: Diagnosis not present

## 2019-10-14 DIAGNOSIS — Z8679 Personal history of other diseases of the circulatory system: Secondary | ICD-10-CM

## 2019-10-14 DIAGNOSIS — R21 Rash and other nonspecific skin eruption: Secondary | ICD-10-CM | POA: Diagnosis not present

## 2019-10-14 DIAGNOSIS — L309 Dermatitis, unspecified: Secondary | ICD-10-CM | POA: Diagnosis not present

## 2019-10-14 DIAGNOSIS — Z872 Personal history of diseases of the skin and subcutaneous tissue: Secondary | ICD-10-CM | POA: Diagnosis not present

## 2019-10-14 DIAGNOSIS — Z8709 Personal history of other diseases of the respiratory system: Secondary | ICD-10-CM | POA: Diagnosis not present

## 2019-10-14 NOTE — Patient Instructions (Addendum)
PWound Care Instructions  1. Cleanse wound gently with soap and water once a day then pat dry with clean gauze. Apply a thing coat of Petrolatum (petroleum jelly, "Vaseline") over the wound (unless you have an allergy to this). We recommend that you use a new, sterile tube of Vaseline. Do not pick or remove scabs. Do not remove the yellow or white "healing tissue" from the base of the wound.  2. Cover the wound with fresh, clean, nonstick gauze and secure with paper tape. You may use Band-Aids in place of gauze and tape if the would is small enough, but would recommend trimming much of the tape off as there is often too much. Sometimes Band-Aids can irritate the skin.  3. You should call the office for your biopsy report after 1 week if you have not already been contacted.  4. If you experience any problems, such as abnormal amounts of bleeding, swelling, significant bruising, significant pain, or evidence of infection, please call the office immediately.  5. FOR ADULT SURGERY PATIENTS: If you need something for pain relief you may take 1 extra strength Tylenol (acetaminophen) AND 2 Ibuprofen (200mg  each) together every 4 hours as needed for pain. (do not take these if you are allergic to them or if you have a reason you should not take them.) Typically, you may only need pain medication for 1 to 3 days.

## 2019-10-14 NOTE — Progress Notes (Signed)
   New Patient Visit  Subjective  Michelle Dalton is a 59 y.o. female who presents for the following: Rash. On the chest and back that started about 8 months ago occasionally itches. Patient is currently using Clotrimazole and Betamethsone BID x 3 months, but hasn't noticed an improvement in condition. Personal and fhx of eczema, but she states that she hasn't had any recent flares.  The following portions of the chart were reviewed this encounter and updated as appropriate:  Tobacco  Allergies  Meds  Problems  Med Hx  Surg Hx  Fam Hx     Review of Systems:  No other skin or systemic complaints except as noted in HPI or Assessment and Plan.  Objective  Well appearing patient in no apparent distress; mood and affect are within normal limits.  A focused examination was performed including the trunk. Relevant physical exam findings are noted in the Assessment and Plan.  Objective  R shoulder, Right Upper Back: Patchy hyperpigmented eruption almost confluent over the back and chest.   Images        Assessment & Plan  Rash (2) Right Upper Back; R shoulder  Eczema vs tinea versicolor vs corynebacterium - patient has a hx of COPD, RA, and HTN  Skin / nail biopsy - R shoulder Type of biopsy: punch   Informed consent: discussed and consent obtained   Timeout: patient name, date of birth, surgical site, and procedure verified   Procedure prep:  Patient was prepped and draped in usual sterile fashion (the patient was cleaned and prepped) Prep type:  Isopropyl alcohol Anesthesia: the lesion was anesthetized in a standard fashion   Anesthetic:  1% lidocaine w/ epinephrine 1-100,000 buffered w/ 8.4% NaHCO3 Punch size:  3 mm Suture size:  4-0 Suture type: nylon   Hemostasis achieved with: suture, pressure and aluminum chloride   Outcome: patient tolerated procedure well   Post-procedure details: sterile dressing applied and wound care instructions given   Dressing type:  bandage, petrolatum and pressure dressing    Skin / nail biopsy - Right Upper Back Type of biopsy: punch   Informed consent: discussed and consent obtained   Timeout: patient name, date of birth, surgical site, and procedure verified   Procedure prep:  Patient was prepped and draped in usual sterile fashion (the patient was cleaned and prepped) Prep type:  Isopropyl alcohol Anesthesia: the lesion was anesthetized in a standard fashion   Anesthetic:  1% lidocaine w/ epinephrine 1-100,000 buffered w/ 8.4% NaHCO3 Punch size:  3 mm Suture size:  4-0 Suture type: nylon   Hemostasis achieved with: suture, pressure and aluminum chloride   Outcome: patient tolerated procedure well   Post-procedure details: sterile dressing applied and wound care instructions given   Dressing type: bandage, petrolatum and pressure dressing    Specimen 2 - Surgical pathology Differential Diagnosis: D48.5 r/o eczema vs tinea versicolor vs corynebacterium  Check Margins: No  Specimen 1 - Surgical pathology Differential Diagnosis: D48.5 r/o eczema vs tinea versicolor vs corynebacterium Check Margins: No  Return in about 1 week (around 10/21/2019) for suture removal and to discuss pathology results.  Luther Redo, CMA, am acting as scribe for Sarina Ser, MD .  Documentation: I have reviewed the above documentation for accuracy and completeness, and I agree with the above.  Sarina Ser, MD

## 2019-10-15 ENCOUNTER — Encounter: Payer: Self-pay | Admitting: Dermatology

## 2019-10-21 ENCOUNTER — Encounter: Payer: Self-pay | Admitting: Dermatology

## 2019-10-21 ENCOUNTER — Other Ambulatory Visit: Payer: Self-pay

## 2019-10-21 ENCOUNTER — Ambulatory Visit (INDEPENDENT_AMBULATORY_CARE_PROVIDER_SITE_OTHER): Payer: Medicare HMO | Admitting: Dermatology

## 2019-10-21 DIAGNOSIS — L308 Other specified dermatitis: Secondary | ICD-10-CM | POA: Diagnosis not present

## 2019-10-21 DIAGNOSIS — L309 Dermatitis, unspecified: Secondary | ICD-10-CM

## 2019-10-21 MED ORDER — TRIAMCINOLONE ACETONIDE 0.1 % EX CREA: 1.0000 "application " | TOPICAL_CREAM | Freq: Two times a day (BID) | CUTANEOUS | 1 refills | Status: DC

## 2019-10-21 NOTE — Progress Notes (Signed)
   Follow-Up Visit   Subjective  Michelle Dalton is a 59 y.o. female who presents for the following: Follow-up (Biopsy follow up - Eczema vs Contact Dermatitis).  The following portions of the chart were reviewed this encounter and updated as appropriate:  Tobacco  Allergies  Meds  Problems  Med Hx  Surg Hx  Fam Hx     Review of Systems:  No other skin or systemic complaints except as noted in HPI or Assessment and Plan.  Objective  Well appearing patient in no apparent distress; mood and affect are within normal limits.  A focused examination was performed including back. Relevant physical exam findings are noted in the Assessment and Plan.  Objective  Right Upper Back: Patchy hyperpigmented eruption almost confluent over the back and chest.   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 Discussed pathology results.   Start Triamcinolone 0.1% cream bid. Avoid face, groin, underarms  Will plan patch testing on follow up.  triamcinolone cream (KENALOG) 0.1 % - Right Upper Back  Other Related Procedures Patch Test  Return for patch testing with nurse day 1 and 7, follow up with Dr. Nehemiah Massed day 14.  I, Ashok Cordia, CMA, am acting as scribe for Sarina Ser, MD .  Documentation: I have reviewed the above documentation for accuracy and completeness, and I agree with the above.  Sarina Ser, MD

## 2019-10-26 ENCOUNTER — Ambulatory Visit: Payer: Medicare HMO

## 2019-10-28 DIAGNOSIS — Z227 Latent tuberculosis: Secondary | ICD-10-CM | POA: Diagnosis not present

## 2019-10-28 DIAGNOSIS — Z79899 Other long term (current) drug therapy: Secondary | ICD-10-CM | POA: Diagnosis not present

## 2019-10-28 DIAGNOSIS — M7918 Myalgia, other site: Secondary | ICD-10-CM | POA: Diagnosis not present

## 2019-10-28 DIAGNOSIS — M059 Rheumatoid arthritis with rheumatoid factor, unspecified: Secondary | ICD-10-CM | POA: Diagnosis not present

## 2019-11-02 ENCOUNTER — Ambulatory Visit: Payer: Medicare HMO

## 2019-11-03 DIAGNOSIS — J209 Acute bronchitis, unspecified: Secondary | ICD-10-CM | POA: Diagnosis not present

## 2019-11-03 DIAGNOSIS — J449 Chronic obstructive pulmonary disease, unspecified: Secondary | ICD-10-CM | POA: Diagnosis not present

## 2019-11-18 ENCOUNTER — Other Ambulatory Visit: Payer: Medicare HMO

## 2020-01-18 DIAGNOSIS — H6981 Other specified disorders of Eustachian tube, right ear: Secondary | ICD-10-CM | POA: Diagnosis not present

## 2020-02-01 DIAGNOSIS — Z6841 Body Mass Index (BMI) 40.0 and over, adult: Secondary | ICD-10-CM | POA: Diagnosis not present

## 2020-02-01 DIAGNOSIS — M059 Rheumatoid arthritis with rheumatoid factor, unspecified: Secondary | ICD-10-CM | POA: Diagnosis not present

## 2020-02-01 DIAGNOSIS — D170 Benign lipomatous neoplasm of skin and subcutaneous tissue of head, face and neck: Secondary | ICD-10-CM | POA: Diagnosis not present

## 2020-02-01 DIAGNOSIS — Z79899 Other long term (current) drug therapy: Secondary | ICD-10-CM | POA: Diagnosis not present

## 2020-02-01 DIAGNOSIS — Z227 Latent tuberculosis: Secondary | ICD-10-CM | POA: Diagnosis not present

## 2020-02-01 DIAGNOSIS — M8949 Other hypertrophic osteoarthropathy, multiple sites: Secondary | ICD-10-CM | POA: Diagnosis not present

## 2020-03-08 DIAGNOSIS — R0600 Dyspnea, unspecified: Secondary | ICD-10-CM | POA: Diagnosis not present

## 2020-03-08 DIAGNOSIS — Z1159 Encounter for screening for other viral diseases: Secondary | ICD-10-CM | POA: Diagnosis not present

## 2020-03-08 DIAGNOSIS — R7611 Nonspecific reaction to tuberculin skin test without active tuberculosis: Secondary | ICD-10-CM | POA: Diagnosis not present

## 2020-03-08 DIAGNOSIS — R059 Cough, unspecified: Secondary | ICD-10-CM | POA: Diagnosis not present

## 2020-03-08 DIAGNOSIS — J310 Chronic rhinitis: Secondary | ICD-10-CM | POA: Diagnosis not present

## 2020-03-08 DIAGNOSIS — G479 Sleep disorder, unspecified: Secondary | ICD-10-CM | POA: Diagnosis not present

## 2020-03-08 DIAGNOSIS — I10 Essential (primary) hypertension: Secondary | ICD-10-CM | POA: Diagnosis not present

## 2020-03-08 DIAGNOSIS — R7303 Prediabetes: Secondary | ICD-10-CM | POA: Diagnosis not present

## 2020-03-08 DIAGNOSIS — J449 Chronic obstructive pulmonary disease, unspecified: Secondary | ICD-10-CM | POA: Diagnosis not present

## 2020-03-11 DIAGNOSIS — Z0100 Encounter for examination of eyes and vision without abnormal findings: Secondary | ICD-10-CM | POA: Diagnosis not present

## 2020-03-15 ENCOUNTER — Other Ambulatory Visit: Payer: Self-pay | Admitting: Family Medicine

## 2020-03-15 DIAGNOSIS — Z0000 Encounter for general adult medical examination without abnormal findings: Secondary | ICD-10-CM | POA: Diagnosis not present

## 2020-03-15 DIAGNOSIS — Z1231 Encounter for screening mammogram for malignant neoplasm of breast: Secondary | ICD-10-CM

## 2020-03-15 DIAGNOSIS — Z6841 Body Mass Index (BMI) 40.0 and over, adult: Secondary | ICD-10-CM | POA: Diagnosis not present

## 2020-04-04 ENCOUNTER — Other Ambulatory Visit: Payer: Self-pay

## 2020-04-04 ENCOUNTER — Ambulatory Visit
Admission: RE | Admit: 2020-04-04 | Discharge: 2020-04-04 | Disposition: A | Discharge status: Home / Self Care | Payer: Medicare HMO | Source: Ambulatory Visit | Attending: Family Medicine | Admitting: Family Medicine

## 2020-04-04 DIAGNOSIS — Z1231 Encounter for screening mammogram for malignant neoplasm of breast: Secondary | ICD-10-CM | POA: Diagnosis not present

## 2020-04-10 ENCOUNTER — Other Ambulatory Visit: Payer: Self-pay | Admitting: Family Medicine

## 2020-04-10 DIAGNOSIS — R928 Other abnormal and inconclusive findings on diagnostic imaging of breast: Secondary | ICD-10-CM

## 2020-04-10 DIAGNOSIS — R921 Mammographic calcification found on diagnostic imaging of breast: Secondary | ICD-10-CM

## 2020-04-14 ENCOUNTER — Other Ambulatory Visit: Payer: Self-pay

## 2020-04-14 ENCOUNTER — Ambulatory Visit
Admission: RE | Admit: 2020-04-14 | Discharge: 2020-04-14 | Disposition: A | Discharge status: Home / Self Care | Payer: Medicare HMO | Source: Ambulatory Visit | Attending: Family Medicine | Admitting: Family Medicine

## 2020-04-14 DIAGNOSIS — R921 Mammographic calcification found on diagnostic imaging of breast: Secondary | ICD-10-CM | POA: Diagnosis not present

## 2020-04-14 DIAGNOSIS — R928 Other abnormal and inconclusive findings on diagnostic imaging of breast: Secondary | ICD-10-CM | POA: Diagnosis not present

## 2020-04-14 DIAGNOSIS — R922 Inconclusive mammogram: Secondary | ICD-10-CM | POA: Diagnosis not present

## 2020-04-27 ENCOUNTER — Other Ambulatory Visit: Payer: Self-pay | Admitting: Family Medicine

## 2020-04-27 DIAGNOSIS — R921 Mammographic calcification found on diagnostic imaging of breast: Secondary | ICD-10-CM

## 2020-06-29 DIAGNOSIS — H6981 Other specified disorders of Eustachian tube, right ear: Secondary | ICD-10-CM | POA: Diagnosis not present

## 2020-06-29 DIAGNOSIS — H6121 Impacted cerumen, right ear: Secondary | ICD-10-CM | POA: Diagnosis not present

## 2020-07-24 DIAGNOSIS — Z6841 Body Mass Index (BMI) 40.0 and over, adult: Secondary | ICD-10-CM | POA: Diagnosis not present

## 2020-07-24 DIAGNOSIS — F331 Major depressive disorder, recurrent, moderate: Secondary | ICD-10-CM | POA: Diagnosis not present

## 2020-07-24 DIAGNOSIS — I10 Essential (primary) hypertension: Secondary | ICD-10-CM | POA: Diagnosis not present

## 2020-10-31 ENCOUNTER — Ambulatory Visit
Admission: RE | Admit: 2020-10-31 | Discharge: 2020-10-31 | Disposition: A | Discharge status: Home / Self Care | Payer: Medicare Other | Source: Ambulatory Visit | Attending: Family Medicine | Admitting: Family Medicine

## 2020-10-31 ENCOUNTER — Other Ambulatory Visit: Payer: Self-pay

## 2020-10-31 DIAGNOSIS — R921 Mammographic calcification found on diagnostic imaging of breast: Secondary | ICD-10-CM | POA: Diagnosis not present

## 2020-11-02 ENCOUNTER — Other Ambulatory Visit: Payer: Self-pay | Admitting: Family Medicine

## 2020-11-02 DIAGNOSIS — R921 Mammographic calcification found on diagnostic imaging of breast: Secondary | ICD-10-CM

## 2020-11-02 DIAGNOSIS — Z1231 Encounter for screening mammogram for malignant neoplasm of breast: Secondary | ICD-10-CM

## 2021-01-09 ENCOUNTER — Other Ambulatory Visit: Payer: Self-pay

## 2021-01-09 ENCOUNTER — Emergency Department
Admission: EM | Admit: 2021-01-09 | Discharge: 2021-01-09 | Disposition: A | Discharge status: Home / Self Care | Payer: Medicare Other | Attending: Emergency Medicine | Admitting: Emergency Medicine

## 2021-01-09 ENCOUNTER — Emergency Department: Payer: Medicare Other

## 2021-01-09 DIAGNOSIS — J449 Chronic obstructive pulmonary disease, unspecified: Secondary | ICD-10-CM | POA: Insufficient documentation

## 2021-01-09 DIAGNOSIS — K0889 Other specified disorders of teeth and supporting structures: Secondary | ICD-10-CM | POA: Diagnosis present

## 2021-01-09 DIAGNOSIS — J4521 Mild intermittent asthma with (acute) exacerbation: Secondary | ICD-10-CM | POA: Insufficient documentation

## 2021-01-09 MED ORDER — OXYCODONE-ACETAMINOPHEN 5-325 MG PO TABS
1.0000 | ORAL_TABLET | Freq: Once | ORAL | Status: AC
  Administered 2021-01-09: 1 via ORAL
  Filled 2021-01-09: qty 1

## 2021-01-09 MED ORDER — METHYLPREDNISOLONE 4 MG PO TBPK: ORAL_TABLET | ORAL | 0 refills | Status: DC

## 2021-01-09 MED ORDER — PREDNISONE 20 MG PO TABS
60.0000 mg | ORAL_TABLET | Freq: Once | ORAL | Status: DC
  Filled 2021-01-09: qty 3

## 2021-01-09 MED ORDER — AMOXICILLIN-POT CLAVULANATE 875-125 MG PO TABS: 1.0000 | ORAL_TABLET | Freq: Two times a day (BID) | ORAL | 0 refills | Status: AC

## 2021-01-09 MED ORDER — ONDANSETRON 4 MG PO TBDP
4.0000 mg | ORAL_TABLET | Freq: Once | ORAL | Status: AC
  Administered 2021-01-09: 4 mg via ORAL
  Filled 2021-01-09: qty 1

## 2021-01-09 MED ORDER — HYDROCODONE-ACETAMINOPHEN 5-325 MG PO TABS: 1.0000 | ORAL_TABLET | Freq: Four times a day (QID) | ORAL | 0 refills | Status: AC | PRN

## 2021-01-09 MED ORDER — PREDNISONE 20 MG PO TABS: 40.0000 mg | ORAL_TABLET | Freq: Every day | ORAL | 0 refills | Status: DC

## 2021-01-09 NOTE — ED Provider Notes (Signed)
[facility] Provider Note    Event Date/Time   First MD Initiated Contact with Patient 01/09/21 1240     (approximate)   History   Asthma   HPI  Michelle Dalton is a 61 y.o. female   with past medical history of COPD, RA, here with dental pain as well as shortness of breath.  The patient's primary complaint is dental pain.  She was actually at a dentist today to get her tooth pulled.  She has had aching, throbbing, right upper and right lower tooth pain for the last several days.  Denies any facial swelling.  Pain is fairly constant.  She believes she had cracked her lower tooth in the right upper tooth is a cavity.  She has a history of the same and has had multiple teeth removed.  She is actually had a dentist to get these removed, when she had to go reportedly across the street because her bathroom was broken.  She states that while going, she began to get short of breath and feels like she began to have a asthma attack.  She was also slightly agitated about having to leave the office.  She reports that in the bathroom, she began feeling significantly short of breath, wheezing, with dry cough.  She used her albuterol inhalers without significant relief so she presents for further evaluation.  She now feels somewhat back to her baseline.  She states she has had no cough or shortness of breath prior to the onset of symptoms while she was walking.  Again, her major complaint currently is the tooth pain.  No fevers or chills.  No difficulty speaking or swallowing.      Physical Exam   Triage Vital Signs: ED Triage Vitals  Enc Vitals Group     BP      Pulse      Resp      Temp      Temp src      SpO2      Weight      Height      Head Circumference      Peak Flow      Pain Score      Pain Loc      Pain Edu?      Excl. in [place]?     Most recent vital signs: Vitals:   01/09/21 1244  BP: (!) 159/91  Pulse: 86  Resp: 18  Temp: 98.5 F (36.9 C)  SpO2:  95%     General: Awake, no distress.  CV:  Good peripheral perfusion.  Resp:  Normal effort.  No significant wheezing.  Lungs are clear to auscultation bilaterally.  No rales or rhonchi.  Speaking in full sentences.  No distress. Abd:  No distention.  Other:  Patient has markedly poor dentition diffusely.  The right lower premolar appears cracked with exposed, necrotic pulp.  There is no gingival abscess or significant edema.  Moderate tenderness to palpation.  Right upper premolar is mildly tender as well, with slight gingival irritation but no abscess.  No facial swelling.  No sublingual swelling.  Oropharynx is clear and patent.  Uvula is midline.   ED Results / Procedures / Treatments   Labs (all labs ordered are listed, but only abnormal results are displayed) Labs Reviewed - No data to display   EKG     RADIOLOGY Chest x-ray: Reviewed by me, shows possible atelectasis but no apparent pneumonia.  No acute infiltrate  noted. Agree with radiology interpretation.    PROCEDURES:  Critical Care performed: No  Procedures   MEDICATIONS ORDERED IN ED: Medications  predniSONE (DELTASONE) tablet 60 mg (60 mg Oral Patient Refused/Not Given 01/09/21 1332)  oxyCODONE-acetaminophen (PERCOCET/ROXICET) 5-325 MG per tablet 1 tablet (1 tablet Oral Given 01/09/21 1333)  ondansetron (ZOFRAN-ODT) disintegrating tablet 4 mg (4 mg Oral Given 01/09/21 1333)     IMPRESSION / MDM / ASSESSMENT AND PLAN / ED COURSE  I reviewed the triage vital signs and the nursing notes.                              61 year old female with past medical history of rheumatoid arthritis, asthma/COPD, here with shortness of breath, now significantly improved.  Patient's primary complaint is dental pain, and she does appear to have significant active dental caries and possible reversible pulpitis.  There is no evidence to suggest dental abscess, Ludwig's, or systemic infection from this.  No evidence of facial abscess  or osteomyelitis.  Regarding her shortness of breath, patient has a long history of COPD and asthma.  I reviewed her records from recent visit with Dr. Posey Pronto on 12/14 as well as Dr. Raul Del, her pulmonologist.  It sounds like she likely had asthma exacerbation in the setting of walking across the street at the appointment with increased rest, which she states has triggered these in the past.  She has normal work of breathing now, no wheezing, and is satting well on room air.  She was given a breathing treatment.  Chest x-ray is clear.  She states her symptoms are not resolved after breathing treatment.  For her dental pain, she will need dental follow-up which unfortunately was postponed due to her symptoms today.  Given her use of immunosuppressants, will treat with amoxicillin in addition to analgesics.  No evidence of abscess or systemic infection at this time.  Regarding her shortness of breath, this is essentially resolved after breathing treatment and seems situational.  Chest x-ray clear.  She is satting well on room air.  No ongoing wheezing.  She has have a fairly extensive history of asthma and COPD exacerbations.  Will give a Medrol Dosepak in the event of possible underlying bronchospasm and inflammation, as well as antibiotics for her teeth.  Return precautions given.       FINAL CLINICAL IMPRESSION(S) / ED DIAGNOSES   Final diagnoses:  Mild intermittent asthma with exacerbation  Pain, dental     Rx / DC Orders   ED Discharge Orders          Ordered    amoxicillin-clavulanate (AUGMENTIN) 875-125 MG tablet  2 times daily        01/09/21 1327    predniSONE (DELTASONE) 20 MG tablet  Daily,   Status:  Discontinued        01/09/21 1327    HYDROcodone-acetaminophen (NORCO/VICODIN) 5-325 MG tablet  Every 6 hours PRN        01/09/21 1327    methylPREDNISolone (MEDROL DOSEPAK) 4 MG TBPK tablet        01/09/21 1410             Note:  This document was prepared using Dragon  voice recognition software and may include unintentional dictation errors.   Duffy Bruce, MD 01/09/21 [phone]

## 2021-01-09 NOTE — ED Notes (Signed)
See triage note. Pt sitting calmly on stretcher; resp reg/unlabored; skin dry; family member sitting at bedside.

## 2021-01-09 NOTE — ED Triage Notes (Addendum)
Pt comes into the ED via EMS from [place] a, c/o having a sudden onset asthma attack, hx of COPD and asthma, states she used her inhaler 5x PTA  CBG 98 197/50 97%RA HR97

## 2021-01-24 ENCOUNTER — Ambulatory Visit: Payer: Medicare Other | Admitting: Dermatology

## 2021-02-07 ENCOUNTER — Ambulatory Visit: Payer: Medicaid Other | Admitting: Dermatology

## 2021-03-06 ENCOUNTER — Emergency Department: Payer: Medicare Other

## 2021-03-06 ENCOUNTER — Emergency Department
Admission: EM | Admit: 2021-03-06 | Discharge: 2021-03-06 | Disposition: A | Discharge status: Home / Self Care | Payer: Medicare Other | Attending: Emergency Medicine | Admitting: Emergency Medicine

## 2021-03-06 ENCOUNTER — Other Ambulatory Visit: Payer: Self-pay

## 2021-03-06 DIAGNOSIS — J45909 Unspecified asthma, uncomplicated: Secondary | ICD-10-CM | POA: Diagnosis not present

## 2021-03-06 DIAGNOSIS — R079 Chest pain, unspecified: Secondary | ICD-10-CM | POA: Insufficient documentation

## 2021-03-06 DIAGNOSIS — I10 Essential (primary) hypertension: Secondary | ICD-10-CM | POA: Diagnosis not present

## 2021-03-06 DIAGNOSIS — R0789 Other chest pain: Secondary | ICD-10-CM

## 2021-03-06 LAB — CBC
HCT: 40.8 % (ref 36.0–46.0)
Hemoglobin: 12.9 g/dL (ref 12.0–15.0)
MCH: 28.9 pg (ref 26.0–34.0)
MCHC: 31.6 g/dL (ref 30.0–36.0)
MCV: 91.5 fL (ref 80.0–100.0)
Platelets: 296 10*3/uL (ref 150–400)
RBC: 4.46 MIL/uL (ref 3.87–5.11)
RDW: 14.2 % (ref 11.5–15.5)
WBC: 8.8 10*3/uL (ref 4.0–10.5)
nRBC: 0 % (ref 0.0–0.2)

## 2021-03-06 LAB — TROPONIN I (HIGH SENSITIVITY): Troponin I (High Sensitivity): 8 ng/L (ref <18)

## 2021-03-06 LAB — BASIC METABOLIC PANEL
Anion gap: 7 (ref 5–15)
BUN: 14 mg/dL (ref 8–23)
CO2: 30 mmol/L (ref 22–32)
Calcium: 9.4 mg/dL (ref 8.9–10.3)
Chloride: 102 mmol/L (ref 98–111)
Creatinine, Ser: 0.82 mg/dL (ref 0.44–1.00)
GFR, Estimated: >60 mL/min (ref >60)
Glucose, Bld: 104 mg/dL — ABNORMAL HIGH (ref 70–99)
Potassium: 3.6 mmol/L (ref 3.5–5.1)
Sodium: 139 mmol/L (ref 135–145)

## 2021-03-06 MED ORDER — NAPROXEN 500 MG PO TABS: 500.0000 mg | ORAL_TABLET | Freq: Two times a day (BID) | ORAL | 2 refills | Status: DC

## 2021-03-06 MED ORDER — IOHEXOL 350 MG/ML SOLN
75.0000 mL | Freq: Once | INTRAVENOUS | Status: AC | PRN
  Administered 2021-03-06: 75 mL via INTRAVENOUS

## 2021-03-06 NOTE — ED Notes (Signed)
Repeat CT at 423 pm, if no change, d/c eliquis, d/c, f/u with Danelle Berry, MD 03/06/21 1150

## 2021-03-06 NOTE — ED Provider Notes (Signed)
[facility] Provider Note    Event Date/Time   First MD Initiated Contact with Patient 03/06/21 [phone]     (approximate)   History   Chest Pain   HPI  Michelle Dalton is a 61 y.o. female with a history of asthma, GERD, hypertension who presents with complaints of left-sided chest pain.  Patient reports she has an aching under her left breast which has been present for 2 days.  It has been constant.  No radiation.  No shortness of breath.  No cough fevers or chills.  No history of blood clots.  No calf pain or swelling.     Physical Exam   Triage Vital Signs: ED Triage Vitals  Enc Vitals Group     BP 03/06/21 0917 (!) 165/95     Pulse Rate 03/06/21 0915 77     Resp 03/06/21 0915 16     Temp 03/06/21 0915 97.8 F (36.6 C)     Temp Source 03/06/21 0915 Oral     SpO2 03/06/21 0915 99 %     Weight 03/06/21 0917 111.1 kg (245 lb)     Height 03/06/21 0917 1.702 m (5\' 7" )     Head Circumference --      Peak Flow --      Pain Score 03/06/21 0916 9     Pain Loc --      Pain Edu? --      Excl. in [place]? --     Most recent vital signs: Vitals:   03/06/21 0915 03/06/21 0917  BP:  (!) 165/95  Pulse: 77 79  Resp: 16 20  Temp: 97.8 F (36.6 C)   SpO2: 99% 100%     General: Awake, no distress.  CV:  Good peripheral perfusion.  No tenderness palpation, no redness or rash Resp:  Normal effort.  CTA B Abd:  No distention.  Other:  No calf pain or swelling   ED Results / Procedures / Treatments   Labs (all labs ordered are listed, but only abnormal results are displayed) Labs Reviewed  CBC  BASIC METABOLIC PANEL  TROPONIN I (HIGH SENSITIVITY)     EKG  ED ECG REPORT I, Lavonia Drafts, the attending physician, personally viewed and interpreted this ECG.  Date: 03/06/2021  Rhythm: normal sinus rhythm QRS Axis: normal Intervals: normal ST/T Wave abnormalities: Nonspecific changes Narrative Interpretation: no evidence of acute  ischemia    RADIOLOGY CT angiography viewed interpreted by me, no PE noted, pending radiology review    PROCEDURES:  Critical Care performed:   Procedures   MEDICATIONS ORDERED IN ED: Medications - No data to display   IMPRESSION / MDM / Rafael Capo / ED COURSE  I reviewed the triage vital signs and the nursing notes.   Patient presents with left-sided chest discomfort as detailed above.  Differential includes ACS, chest wall pain, pneumonia, pleural effusion.  Does not seem to worsen with exertion, no shortness of breath, no pleurisy reported.  Doubt PE.   EKG with nonspecific changes.  Pending labs including troponin, pending x-ray.  High sensitive troponin is normal, chest x-ray without evidence of infiltrate  We will send for CT angiography  CT scan is negative for PE.  Considered admission for atypical chest pain however the patient's work-up is quite reassuring.  She is feeling somewhat improved.  She does have close follow-up with Dr. Clayborn Bigness of cardiology.  We will start the patient on NSAIDs for possible chest wall pain.  Strict return precautions, patient agrees with this plan         FINAL CLINICAL IMPRESSION(S) / ED DIAGNOSES   Final diagnoses:  None     Rx / DC Orders   ED Discharge Orders     None        Note:  This document was prepared using Dragon voice recognition software and may include unintentional dictation errors.   Lavonia Drafts, MD 03/06/21 [phone]

## 2021-03-06 NOTE — ED Notes (Signed)
EDP at bedside  

## 2021-03-06 NOTE — ED Triage Notes (Signed)
From home, [facility] CP under L breast since 2 days, non radiating No SOB, hx asthma and COPD Worse when lying flat 325 ASA, 12 lead normal, VS normal, 20g R AC

## 2021-03-06 NOTE — ED Notes (Signed)
Provided warm blankets. Pt on phone with family.

## 2021-03-06 NOTE — ED Notes (Signed)
Urine sample sent to lab

## 2021-03-06 NOTE — ED Notes (Signed)
Pt taken to xray and now back.

## 2021-03-06 NOTE — ED Notes (Signed)
Pt back from CT

## 2021-03-06 NOTE — ED Notes (Signed)
Pt in CT. Pt was given gingerale per request, ok'd by EDP.

## 2021-03-19 ENCOUNTER — Other Ambulatory Visit
Admission: RE | Admit: 2021-03-19 | Discharge: 2021-03-19 | Disposition: A | Discharge status: Home / Self Care | Payer: Medicare Other | Source: Ambulatory Visit | Attending: Student | Admitting: Student

## 2021-03-19 DIAGNOSIS — R0609 Other forms of dyspnea: Secondary | ICD-10-CM | POA: Diagnosis present

## 2021-03-19 LAB — BRAIN NATRIURETIC PEPTIDE: B Natriuretic Peptide: 77.2 pg/mL (ref 0.0–100.0)

## 2021-04-05 ENCOUNTER — Inpatient Hospital Stay: Admission: RE | Admit: 2021-04-05 | Payer: Commercial Managed Care - HMO | Source: Ambulatory Visit

## 2021-04-25 ENCOUNTER — Other Ambulatory Visit: Payer: Self-pay | Admitting: Student

## 2021-04-25 DIAGNOSIS — I208 Other forms of angina pectoris: Secondary | ICD-10-CM

## 2021-04-26 ENCOUNTER — Ambulatory Visit
Admission: RE | Admit: 2021-04-26 | Discharge: 2021-04-26 | Disposition: A | Discharge status: Home / Self Care | Payer: Medicare Other | Source: Ambulatory Visit | Attending: Family Medicine | Admitting: Family Medicine

## 2021-04-26 DIAGNOSIS — R921 Mammographic calcification found on diagnostic imaging of breast: Secondary | ICD-10-CM

## 2021-04-26 DIAGNOSIS — Z1231 Encounter for screening mammogram for malignant neoplasm of breast: Secondary | ICD-10-CM | POA: Insufficient documentation

## 2021-05-01 ENCOUNTER — Other Ambulatory Visit (HOSPITAL_COMMUNITY): Payer: Self-pay | Admitting: Emergency Medicine

## 2021-05-01 DIAGNOSIS — R079 Chest pain, unspecified: Secondary | ICD-10-CM

## 2021-05-01 MED ORDER — IVABRADINE HCL 5 MG PO TABS: 15.0000 mg | ORAL_TABLET | Freq: Once | ORAL | 0 refills | Status: AC

## 2021-05-01 MED ORDER — METOPROLOL TARTRATE 100 MG PO TABS: 100.0000 mg | ORAL_TABLET | Freq: Once | ORAL | 0 refills | Status: DC

## 2021-05-02 ENCOUNTER — Telehealth (HOSPITAL_COMMUNITY): Payer: Self-pay | Admitting: Emergency Medicine

## 2021-05-02 NOTE — Telephone Encounter (Signed)
Reaching out to patient to offer assistance regarding upcoming cardiac imaging study; pt verbalizes understanding of appt date/time, parking situation and where to check in, pre-test NPO status and medications ordered, and verified current allergies; name and call back number provided for further questions should they arise ?Marchia Bond RN Navigator Cardiac Imaging ?[facility] ?[phone] office ?[phone] cell ? ?Denies iv issues  ?'100mg'$  metoprolol + '15mg'$  ivabradine  ?Arrival 945, uses transportation ?

## 2021-05-03 ENCOUNTER — Ambulatory Visit
Admission: RE | Admit: 2021-05-03 | Discharge: 2021-05-03 | Disposition: A | Discharge status: Home / Self Care | Payer: Medicare Other | Source: Ambulatory Visit | Attending: Student | Admitting: Student

## 2021-05-03 ENCOUNTER — Other Ambulatory Visit: Payer: Self-pay

## 2021-05-03 DIAGNOSIS — I208 Other forms of angina pectoris: Secondary | ICD-10-CM | POA: Insufficient documentation

## 2021-05-03 HISTORY — DX: Systemic involvement of connective tissue, unspecified: M35.9

## 2021-05-03 LAB — POCT I-STAT CREATININE: Creatinine, Ser: 0.9 mg/dL (ref 0.44–1.00)

## 2021-05-03 MED ORDER — IOHEXOL 350 MG/ML SOLN
100.0000 mL | Freq: Once | INTRAVENOUS | Status: AC | PRN
  Administered 2021-05-03: 100 mL via INTRAVENOUS

## 2021-05-03 MED ORDER — DILTIAZEM HCL 25 MG/5ML IV SOLN: 10.0000 mg | Freq: Once | INTRAVENOUS | Status: DC

## 2021-05-03 MED ORDER — NITROGLYCERIN 0.4 MG SL SUBL
0.8000 mg | SUBLINGUAL_TABLET | Freq: Once | SUBLINGUAL | Status: AC
  Administered 2021-05-03: 0.8 mg via SUBLINGUAL

## 2021-05-03 MED ORDER — METOPROLOL TARTRATE 5 MG/5ML IV SOLN
10.0000 mg | Freq: Once | INTRAVENOUS | Status: AC
  Administered 2021-05-03: 10 mg via INTRAVENOUS

## 2021-05-03 MED ORDER — METOPROLOL TARTRATE 5 MG/5ML IV SOLN: 10.0000 mg | Freq: Once | INTRAVENOUS | Status: DC

## 2021-05-03 NOTE — Progress Notes (Signed)
Patient arrived for Cardiac CT. Took Metoprolol '100mg'$  prior to arrival and unable to take Ivabradine due to cost of medication. Patient attempted to have CT however was unable to proceed laying supine because does not take lay on her back due to shortness of breath. Patient was repositioned and was able to proceed until patient did not want to proceed any further due to additional medication administration to lower her heart rate for CT. Patient stated she doesn't like taking medications and is nervous. Notified nurse navigator and patient said she will contact her doctor. ?

## 2022-03-26 ENCOUNTER — Other Ambulatory Visit: Payer: Self-pay | Admitting: Family Medicine

## 2022-03-26 DIAGNOSIS — Z1231 Encounter for screening mammogram for malignant neoplasm of breast: Secondary | ICD-10-CM

## 2022-06-26 ENCOUNTER — Ambulatory Visit
Admission: RE | Admit: 2022-06-26 | Discharge: 2022-06-26 | Disposition: A | Discharge status: Home / Self Care | Payer: 59 | Source: Ambulatory Visit | Attending: Family Medicine | Admitting: Family Medicine

## 2022-06-26 DIAGNOSIS — Z1231 Encounter for screening mammogram for malignant neoplasm of breast: Secondary | ICD-10-CM | POA: Insufficient documentation

## 2022-08-30 ENCOUNTER — Other Ambulatory Visit
Admission: RE | Admit: 2022-08-30 | Discharge: 2022-08-30 | Disposition: A | Discharge status: Home / Self Care | Payer: 59 | Source: Ambulatory Visit | Attending: Physician Assistant | Admitting: Physician Assistant

## 2022-08-30 DIAGNOSIS — R0789 Other chest pain: Secondary | ICD-10-CM | POA: Insufficient documentation

## 2022-08-30 LAB — TROPONIN I (HIGH SENSITIVITY): Troponin I (High Sensitivity): 9 ng/L (ref <18)

## 2022-10-30 ENCOUNTER — Ambulatory Visit: Payer: 59 | Attending: Otolaryngology

## 2022-10-30 DIAGNOSIS — G4733 Obstructive sleep apnea (adult) (pediatric): Secondary | ICD-10-CM | POA: Diagnosis present

## 2022-11-07 ENCOUNTER — Other Ambulatory Visit: Payer: Self-pay | Admitting: Unknown Physician Specialty

## 2022-11-07 DIAGNOSIS — H9041 Sensorineural hearing loss, unilateral, right ear, with unrestricted hearing on the contralateral side: Secondary | ICD-10-CM

## 2022-11-27 ENCOUNTER — Other Ambulatory Visit: Payer: 59

## 2022-12-11 DIAGNOSIS — R011 Cardiac murmur, unspecified: Secondary | ICD-10-CM

## 2022-12-11 HISTORY — DX: Cardiac murmur, unspecified: R01.1

## 2023-01-03 ENCOUNTER — Encounter: Payer: Self-pay | Admitting: Internal Medicine

## 2023-01-22 ENCOUNTER — Ambulatory Visit: Admission: RE | Admit: 2023-01-22 | Payer: 59 | Source: Home / Self Care | Admitting: Internal Medicine

## 2023-01-22 HISTORY — DX: Cardiac murmur, unspecified: R01.1

## 2023-01-22 HISTORY — DX: Sleep apnea, unspecified: G47.30

## 2023-01-22 SURGERY — COLONOSCOPY WITH PROPOFOL
Anesthesia: General

## 2023-03-25 ENCOUNTER — Ambulatory Visit: Payer: 59 | Admitting: Dermatology

## 2023-03-27 ENCOUNTER — Ambulatory Visit: Payer: 59 | Admitting: Dermatology

## 2023-05-02 ENCOUNTER — Other Ambulatory Visit: Payer: Self-pay | Admitting: Unknown Physician Specialty

## 2023-05-02 DIAGNOSIS — H9041 Sensorineural hearing loss, unilateral, right ear, with unrestricted hearing on the contralateral side: Secondary | ICD-10-CM

## 2023-05-06 ENCOUNTER — Other Ambulatory Visit: Payer: Self-pay

## 2023-05-06 ENCOUNTER — Emergency Department
Admission: EM | Admit: 2023-05-06 | Discharge: 2023-05-06 | Disposition: A | Discharge status: Home / Self Care | Attending: Emergency Medicine | Admitting: Emergency Medicine

## 2023-05-06 ENCOUNTER — Emergency Department

## 2023-05-06 DIAGNOSIS — Z79899 Other long term (current) drug therapy: Secondary | ICD-10-CM | POA: Insufficient documentation

## 2023-05-06 DIAGNOSIS — J4489 Other specified chronic obstructive pulmonary disease: Secondary | ICD-10-CM | POA: Insufficient documentation

## 2023-05-06 DIAGNOSIS — R002 Palpitations: Secondary | ICD-10-CM | POA: Diagnosis present

## 2023-05-06 DIAGNOSIS — R0789 Other chest pain: Secondary | ICD-10-CM | POA: Diagnosis not present

## 2023-05-06 DIAGNOSIS — R0602 Shortness of breath: Secondary | ICD-10-CM | POA: Diagnosis not present

## 2023-05-06 DIAGNOSIS — I10 Essential (primary) hypertension: Secondary | ICD-10-CM | POA: Insufficient documentation

## 2023-05-06 DIAGNOSIS — R000 Tachycardia, unspecified: Secondary | ICD-10-CM | POA: Diagnosis not present

## 2023-05-06 LAB — CBC WITH DIFFERENTIAL/PLATELET
Abs Immature Granulocytes: 0.03 10*3/uL (ref 0.00–0.07)
Basophils Absolute: 0.1 10*3/uL (ref 0.0–0.1)
Basophils Relative: 1 %
Eosinophils Absolute: 0.5 10*3/uL (ref 0.0–0.5)
Eosinophils Relative: 4 %
HCT: 39.2 % (ref 36.0–46.0)
Hemoglobin: 12.7 g/dL (ref 12.0–15.0)
Immature Granulocytes: 0 %
Lymphocytes Relative: 26 %
Lymphs Abs: 2.7 10*3/uL (ref 0.7–4.0)
MCH: 29.2 pg (ref 26.0–34.0)
MCHC: 32.4 g/dL (ref 30.0–36.0)
MCV: 90.1 fL (ref 80.0–100.0)
Monocytes Absolute: 1.1 10*3/uL — ABNORMAL HIGH (ref 0.1–1.0)
Monocytes Relative: 10 %
Neutro Abs: 6.1 10*3/uL (ref 1.7–7.7)
Neutrophils Relative %: 59 %
Platelets: 325 10*3/uL (ref 150–400)
RBC: 4.35 MIL/uL (ref 3.87–5.11)
RDW: 14.3 % (ref 11.5–15.5)
WBC: 10.4 10*3/uL (ref 4.0–10.5)
nRBC: 0 % (ref 0.0–0.2)

## 2023-05-06 LAB — BRAIN NATRIURETIC PEPTIDE: B Natriuretic Peptide: 23.7 pg/mL (ref 0.0–100.0)

## 2023-05-06 LAB — RESP PANEL BY RT-PCR (RSV, FLU A&B, COVID)  RVPGX2
Influenza A by PCR: NEGATIVE
Influenza B by PCR: NEGATIVE
Resp Syncytial Virus by PCR: NEGATIVE
SARS Coronavirus 2 by RT PCR: NEGATIVE

## 2023-05-06 LAB — COMPREHENSIVE METABOLIC PANEL WITH GFR
ALT: 23 U/L (ref 0–44)
AST: 24 U/L (ref 15–41)
Albumin: 3.6 g/dL (ref 3.5–5.0)
Alkaline Phosphatase: 57 U/L (ref 38–126)
Anion gap: 3 — ABNORMAL LOW (ref 5–15)
BUN: 15 mg/dL (ref 8–23)
CO2: 27 mmol/L (ref 22–32)
Calcium: 8.8 mg/dL — ABNORMAL LOW (ref 8.9–10.3)
Chloride: 106 mmol/L (ref 98–111)
Creatinine, Ser: 0.91 mg/dL (ref 0.44–1.00)
GFR, Estimated: >60 mL/min (ref >60)
Glucose, Bld: 103 mg/dL — ABNORMAL HIGH (ref 70–99)
Potassium: 3.5 mmol/L (ref 3.5–5.1)
Sodium: 136 mmol/L (ref 135–145)
Total Bilirubin: 0.5 mg/dL (ref 0.0–1.2)
Total Protein: 7.9 g/dL (ref 6.5–8.1)

## 2023-05-06 LAB — TSH: TSH: 1.538 u[IU]/mL (ref 0.350–4.500)

## 2023-05-06 LAB — TROPONIN I (HIGH SENSITIVITY)
Troponin I (High Sensitivity): 8 ng/L (ref <18)
Troponin I (High Sensitivity): 9 ng/L (ref <18)

## 2023-05-06 MED ORDER — SODIUM CHLORIDE 0.9 % IV BOLUS
1000.0000 mL | Freq: Once | INTRAVENOUS | Status: AC
  Administered 2023-05-06: 1000 mL via INTRAVENOUS

## 2023-05-06 NOTE — ED Provider Notes (Signed)
 Care of this patient assumed from prior physician at 0700 pending repeat troponin and disposition. Please see prior physician note for further details.  Briefly, this is a 63 year old female who presented with palpitations and elevated blood pressure in the setting of using over-the-counter Sudafed.  Initial labs reassuring including negative troponin.  CXR without focal consolidation.  EKG with sinus tachycardia, improved prior to my assumption of care. Repeat troponin pending at time of signout, anticipated discharge if reassuring.  9:22 AM Repeat troponin returned within normal limits.  Patient updated on results of workup.  Suspect palpitations and high blood pressure were likely related to her recent decongestant use.  She is comfortable with discharge home.  Strict return precautions provided.  Patient discharged in stable condition.   Claria Crofts, MD 05/06/23 [phone]

## 2023-05-06 NOTE — ED Triage Notes (Signed)
 Pt arrives via EMS from home for complaints of high blood pressure and palpitations - pt states she has been sick with sinus congestion for the past couple days and has been medicating herself with OTC meds to help with that. Pt A/ox4, NAD, MAE, breathing even and unlabored. Denies chest pain at this time.

## 2023-05-06 NOTE — ED Provider Notes (Signed)
 [facility] Provider Note    Event Date/Time   First MD Initiated Contact with Patient 05/06/23 [phone]     (approximate)   History   Palpitations and Hypertension   HPI  Michelle Dalton is a 63 y.o. female brought to the ED via EMS from home with a chief complaint of palpitations and elevated blood pressure.  Patient has been experiencing sinus congestion and pressure for the past several days.  Has been taking Sudafed over-the-counter.  Denies fever/chills, chest pain, shortness of breath, abdominal pain, nausea, vomiting or dizziness.     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     Past Surgical History   Past Surgical History:  Procedure Laterality Date   ABDOMINAL HYSTERECTOMY     ANKLE FRACTURE SURGERY Left    FRACTURE SURGERY Right    ANKLE with metal   JOINT REPLACEMENT Bilateral 2015, 2017   TOTAL KNEE REPLACEMENTS   TOTAL HIP ARTHROPLASTY Right 03/19/2016   Procedure: TOTAL HIP ARTHROPLASTY;  Surgeon: Elner Hahn, MD;  Location: [facility] ORS;  Service: Orthopedics;  Laterality: Right;   TOTAL KNEE ARTHROPLASTY Right 02/28/2015   Procedure: TOTAL KNEE ARTHROPLASTY;  Surgeon: Elner Hahn, MD;  Location: [facility] ORS;  Service: Orthopedics;  Laterality: Right;   TUBAL LIGATION       Home Medications   Prior to Admission medications   Medication Sig  Start Date End Date Taking? Authorizing Provider  albuterol  (PROVENTIL ) (2.5 MG/3ML) 0.083% nebulizer solution Inhale 2.5 mg into the lungs every 4 (four) hours as needed. Patient not taking: Reported on 10/14/2019 01/18/16   [provider]  albuterol  (VENTOLIN  HFA) 108 (90 Base) MCG/ACT inhaler Inhale 1-2 puffs into the lungs every 4 (four) hours as needed for wheezing or shortness of breath.    [provider]  amLODipine  (NORVASC ) 10 MG tablet Take 10 mg by mouth daily. Patient not taking: Reported on 10/14/2019    [provider]  enoxaparin  (LOVENOX ) 40 MG/0.4ML injection Inject 0.4 mLs (40 mg total) into the skin daily. Patient not taking: Reported on 03/02/2019 03/21/16   Rojelio Clement, PA-C  folic acid  (FOLVITE ) 1 MG tablet Take 1 mg by mouth daily. Patient not taking: Reported on 10/14/2019    [provider]  hydrALAZINE  (APRESOLINE ) 10 MG tablet Take 10 mg by mouth 3 (three) times daily.    [provider]  hydrochlorothiazide  (HYDRODIURIL ) 25 MG tablet Take 25 mg by mouth daily.    [provider]  labetalol  (NORMODYNE ) 100 MG tablet Take 100 mg by mouth 2 (two) times daily.    [provider]  loratadine  (CLARITIN ) 10 MG tablet Take 1 tablet (10 mg total) by mouth daily. Patient not taking: Reported on 10/14/2019 04/29/15   Hagler, Harrison Lin,  PA-C  methotrexate (RHEUMATREX) 2.5 MG tablet Take 15 mg by mouth once a week.    [provider]  methylPREDNISolone  (MEDROL  DOSEPAK) 4 MG TBPK tablet Take as directed on packaging 01/09/21   Loman Risk, MD  metoprolol  tartrate (LOPRESSOR ) 100 MG tablet Take 1 tablet (100 mg total) by mouth once for 1 dose. Please take one time dose 100mg  metoprolol  tartrate 2 hr prior to cardiac CT for HR control IF HR >55bpm. 05/01/21 05/01/21  Constancia Delton, MD  mometasone (NASONEX) 50 MCG/ACT nasal spray Place 2 sprays into the nose daily as needed. Patient not taking: Reported on  10/14/2019    [provider]  montelukast  (SINGULAIR ) 10 MG tablet Take 10 mg by mouth daily.     [provider]  Multiple Vitamins-Minerals (MULTIVITAMIN WITH MINERALS) tablet Take 1 tablet by mouth daily. Patient not taking: Reported on 10/14/2019    [provider]  naproxen  (NAPROSYN ) 500 MG tablet Take 1 tablet (500 mg total) by mouth 2 (two) times daily with a meal. 03/06/21   Bryson Carbine, MD  Omega-3 Fatty Acids (FISH OIL) 1000 MG CAPS Take 1 capsule by mouth daily. Patient not taking: Reported on 10/14/2019    [provider]  omeprazole (PRILOSEC) 20 MG capsule Take 20 mg by mouth daily. Patient not taking: Reported on 10/14/2019    [provider]  oxyCODONE  (OXY IR/ROXICODONE ) 5 MG immediate release tablet Take 1-2 tablets (5-10 mg total) by mouth every 4 (four) hours as needed for breakthrough pain. Patient not taking: Reported on 03/02/2019 03/21/16   Rojelio Clement, PA-C  potassium chloride  (K-DUR) 10 MEQ tablet Take 20 mEq by mouth daily. Patient not taking: Reported on 10/14/2019    [provider]  tiotropium (SPIRIVA ) 18 MCG inhalation capsule Place 18 mcg into inhaler and inhale at bedtime.     [provider]  tizanidine (ZANAFLEX) 6 MG capsule Take 6 mg by mouth 3 (three) times daily as needed for muscle spasms. Patient not taking: Reported on 10/14/2019    [provider]  triamcinolone  cream (KENALOG ) 0.1 % Apply 1 application topically in the morning and at bedtime. To affected areas of rash. Avoid face, groin, underarms 10/21/19   Elta Halter, MD     Allergies  Lisinopril   Family History   Family History  Problem Relation Age of Onset   Breast cancer Mother 26   Diabetes Mother    Hypertension Mother      Physical Exam  Triage Vital Signs: ED Triage Vitals  Encounter Vitals Group     BP      Systolic BP Percentile      Diastolic BP Percentile      Pulse      Resp       Temp      Temp src      SpO2      Weight      Height      Head Circumference      Peak Flow      Pain Score      Pain Loc      Pain Education      Exclude from Growth Chart     Updated Vital Signs: BP (!) 187/96 (BP Location: Right Arm)   Pulse (!) 125   Temp 98.3 F (36.8 C) (Oral)   Resp 17   Ht 5\' 7"  (1.702 m)   Wt 116.6 kg   SpO2 97%   BMI  40.25 kg/m    General: Awake, mild distress.  CV:  Tachycardic.  Good peripheral perfusion.  Resp:  Normal effort.  CTAB. Abd:  Nontender.  No distention.  Other:  No thyromegaly.  Nasal congestion noted.   ED Results / Procedures / Treatments  Labs (all labs ordered are listed, but only abnormal results are displayed) Labs Reviewed  CBC WITH DIFFERENTIAL/PLATELET - Abnormal; Notable for the following components:      Result Value   Monocytes Absolute 1.1 (*)    All other components within normal limits  RESP PANEL BY RT-PCR (RSV, FLU A&B, COVID)  [id number]  COMPREHENSIVE METABOLIC PANEL WITH GFR  BRAIN NATRIURETIC PEPTIDE  TSH  TROPONIN I (HIGH SENSITIVITY)     EKG  ED ECG REPORT I, Shyhiem Beeney J, the attending physician, personally viewed and interpreted this ECG.   Date: 05/06/2023  EKG Time: 0626  Rate: 127  Rhythm: sinus tachycardia  Axis: Normal   Intervals:none  ST&T Change: Nonspecific    RADIOLOGY I have independently visualized and interpreted patient's imaging study as well as noted the radiology interpretation:  Chest x-ray: Pending  Official radiology report(s): No results found.   PROCEDURES:  Critical Care performed: No  .1-3 Lead EKG Interpretation  Performed by: Norlene Beavers, MD Authorized by: Norlene Beavers, MD     Interpretation: abnormal     ECG rate:  125   ECG rate assessment: tachycardic     Rhythm: sinus tachycardia     Ectopy: none     Conduction: normal   Comments:     Patient placed on cardiac monitor to evaluate for arrhythmias    MEDICATIONS ORDERED IN  ED: Medications  sodium chloride  0.9 % bolus 1,000 mL (1,000 mLs Intravenous New Bag/Given 05/06/23 0636)     IMPRESSION / MDM / ASSESSMENT AND PLAN / ED COURSE  I reviewed the triage vital signs and the nursing notes.                             63 year old female presenting with palpitations and elevated blood pressure. Differential diagnosis includes, but is not limited to, ACS, aortic dissection, pulmonary embolism, cardiac tamponade, pneumothorax, pneumonia, pericarditis, myocarditis, GI-related causes including esophagitis/gastritis, and musculoskeletal chest wall pain.   I personally reviewed patient's records and note an orthopedic office visit from 04/09/2023 for chronic left knee pain.  Patient's presentation is most consistent with acute complicated illness / injury requiring diagnostic workup.  The patient is on the cardiac monitor to evaluate for evidence of arrhythmia and/or significant heart rate changes.  Will obtain cardiac and respiratory panels, chest x-ray.  Initiate IV fluid resuscitation.  Patient taking Sudafed which is likely contributing to her palpitations and hypertension.  Will reassess.  Clinical Course as of 05/06/23 0658  Tue May 06, 2023  0657 Care transferred to the oncoming provider at change of shift pending labs and cxr. [JS]    Clinical Course User Index [JS] Norlene Beavers, MD     FINAL CLINICAL IMPRESSION(S) / ED DIAGNOSES   Final diagnoses:  Palpitations  Hypertension, unspecified type  Sinus tachycardia     Rx / DC Orders   ED Discharge Orders     None        Note:  This document was prepared using Dragon voice recognition software and may include unintentional dictation errors.   Norlene Beavers, MD 05/06/23 [phone]

## 2023-05-06 NOTE — Discharge Instructions (Addendum)
 You were seen in the ER today for evaluation of your palpitations. Your exam, EKG, and labs were reassuring against an emergency cause for this.  I suspect this was likely related to your recent decongestant use.  You can try Mucinex (guaifenesin) which can buy over-the-counter to help with your symptoms.  Please follow-up with your primary care provider within a few days for reevaluation. Return to the ER for worsening chest pain, difficulty breathing, or any other new or concerning symptoms.

## 2023-05-21 ENCOUNTER — Other Ambulatory Visit

## 2023-05-28 DIAGNOSIS — I071 Rheumatic tricuspid insufficiency: Secondary | ICD-10-CM

## 2023-05-28 DIAGNOSIS — I272 Pulmonary hypertension, unspecified: Secondary | ICD-10-CM

## 2023-05-28 DIAGNOSIS — I340 Nonrheumatic mitral (valve) insufficiency: Secondary | ICD-10-CM

## 2023-05-28 HISTORY — DX: Nonrheumatic mitral (valve) insufficiency: I34.0

## 2023-05-28 HISTORY — DX: Pulmonary hypertension, unspecified: I27.20

## 2023-05-28 HISTORY — DX: Rheumatic tricuspid insufficiency: I07.1

## 2023-07-23 ENCOUNTER — Other Ambulatory Visit: Payer: Self-pay | Admitting: Family Medicine

## 2023-07-23 DIAGNOSIS — Z1231 Encounter for screening mammogram for malignant neoplasm of breast: Secondary | ICD-10-CM

## 2023-08-12 ENCOUNTER — Ambulatory Visit
Admission: RE | Admit: 2023-08-12 | Discharge: 2023-08-12 | Disposition: A | Discharge status: Home / Self Care | Source: Ambulatory Visit | Attending: Family Medicine | Admitting: Family Medicine

## 2023-08-12 DIAGNOSIS — Z1231 Encounter for screening mammogram for malignant neoplasm of breast: Secondary | ICD-10-CM | POA: Diagnosis present

## 2023-09-11 LAB — COLOGUARD: COLOGUARD: POSITIVE — AB

## 2023-09-24 ENCOUNTER — Encounter: Payer: Self-pay | Admitting: Gastroenterology

## 2023-09-25 ENCOUNTER — Encounter: Payer: Self-pay | Admitting: Gastroenterology

## 2023-09-25 NOTE — Anesthesia Preprocedure Evaluation (Signed)
 Anesthesia Evaluation    Airway        Dental   Pulmonary former smoker          Cardiovascular hypertension,      Neuro/Psych    GI/Hepatic   Endo/Other    Renal/GU      Musculoskeletal   Abdominal   Peds  Hematology   Anesthesia Other Findings Office note July 2025: Consider referral to pain psychologist as the patient has demonstrated on a number of occassions significant disability in coping with the pain (displayed by large outburst of uncontrollable crying and hyperventilation).   Reproductive/Obstetrics                              Anesthesia Physical Anesthesia Plan Anesthesia Quick Evaluation

## 2023-09-30 ENCOUNTER — Encounter: Payer: Self-pay | Admitting: Anesthesiology

## 2023-09-30 ENCOUNTER — Ambulatory Visit: Admission: RE | Admit: 2023-09-30 | Source: Home / Self Care | Admitting: Gastroenterology

## 2023-09-30 ENCOUNTER — Encounter: Admission: RE | Payer: Self-pay | Source: Home / Self Care

## 2023-09-30 HISTORY — DX: Other specified chronic obstructive pulmonary disease: J44.89

## 2023-09-30 HISTORY — DX: Rheumatoid arthritis, unspecified: M06.9

## 2023-09-30 HISTORY — DX: Generalized anxiety disorder: F41.1

## 2023-09-30 HISTORY — DX: Morbid (severe) obesity due to excess calories: E66.01

## 2023-09-30 HISTORY — DX: Allergic rhinitis due to pollen: J30.1

## 2023-09-30 HISTORY — DX: Idiopathic sleep related nonobstructive alveolar hypoventilation: G47.34

## 2023-09-30 HISTORY — DX: Myalgia, other site: M79.18

## 2023-09-30 HISTORY — DX: Chronic obstructive pulmonary disease, unspecified: J44.9

## 2023-09-30 HISTORY — DX: Fibromyalgia: M79.7

## 2023-09-30 SURGERY — COLONOSCOPY
Anesthesia: General
